# Patient Record
Sex: Female | Born: 1965 | Marital: Married | Smoking: Former smoker
Health system: Southern US, Community
[De-identification: ages and names within clinical notes are randomized; demographics above are authoritative.]

## PROBLEM LIST (undated history)

## (undated) DIAGNOSIS — J45990 Exercise induced bronchospasm: Secondary | ICD-10-CM

## (undated) DIAGNOSIS — T7840XA Allergy, unspecified, initial encounter: Secondary | ICD-10-CM

## (undated) HISTORY — PX: VAGINAL HYSTERECTOMY: SUR661

## (undated) HISTORY — DX: Allergy, unspecified, initial encounter: T78.40XA

---

## 2005-12-07 ENCOUNTER — Ambulatory Visit: Payer: Self-pay | Admitting: Family Medicine

## 2006-02-24 ENCOUNTER — Ambulatory Visit (HOSPITAL_COMMUNITY): Admission: RE | Admit: 2006-02-24 | Discharge: 2006-02-24 | Payer: Self-pay | Admitting: Gynecology

## 2006-03-10 ENCOUNTER — Encounter: Admission: RE | Admit: 2006-03-10 | Discharge: 2006-03-10 | Payer: Self-pay

## 2007-01-18 ENCOUNTER — Ambulatory Visit: Payer: Self-pay | Admitting: Obstetrics & Gynecology

## 2007-01-18 ENCOUNTER — Encounter: Payer: Self-pay | Admitting: Obstetrics & Gynecology

## 2007-02-01 ENCOUNTER — Ambulatory Visit: Payer: Self-pay | Admitting: Obstetrics & Gynecology

## 2007-03-06 ENCOUNTER — Ambulatory Visit: Payer: Self-pay | Admitting: Gynecology

## 2007-03-07 ENCOUNTER — Encounter: Payer: Self-pay | Admitting: Obstetrics & Gynecology

## 2007-03-07 ENCOUNTER — Inpatient Hospital Stay (HOSPITAL_COMMUNITY): Admission: RE | Admit: 2007-03-07 | Discharge: 2007-03-08 | Payer: Self-pay | Admitting: Obstetrics & Gynecology

## 2007-03-07 ENCOUNTER — Ambulatory Visit: Payer: Self-pay | Admitting: Obstetrics & Gynecology

## 2008-08-04 ENCOUNTER — Emergency Department: Payer: Self-pay | Admitting: Emergency Medicine

## 2011-01-19 NOTE — Assessment & Plan Note (Signed)
NAME:  MISSI, MCMACKIN NO.:  [id number]   MEDICAL RECORD NO.:  [id number]          PATIENT TYPE:  POB   LOCATION:  [facility] at [facility]         FACILITY:  [facility]   PHYSICIAN:  Allie Bossier, MD        DATE OF BIRTH:  [birth year]   DATE OF SERVICE:                                  CLINIC NOTE   HISTORY OF PRESENT ILLNESS:  The patient is a 44 year old, separated,  white, gravida 2, para 2, who comes in for her annual exam.  She reports  that she has been on Opcon 135 without any problems for multiple years  and she wants to try [place].  She is not having any problems with the  Opcon, but she just does not want to think about taking a pill every  day.  She does not want more children in the next 5 years, and  understands that spotting can be associated with Jearld Adjutant for months at a  time.   PAST MEDICAL HISTORY:  Asthma and allergies.   PAST SURGICAL HISTORY:  None.   MEDICATIONS:  1. She takes Singulair on an as-necessary basis.  2. Opcon 135.   ALLERGIES:  NO KNOWN DRUG ALLERGIES.   SOCIAL HISTORY:  She reports wine every day or every other day.   REVIEW OF SYSTEMS:  Her 45 year old and 64 year old children do not have  children.  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  She is currently  separated from her alcoholic husband and in the last several months had  sex with her ex-husband (first marriage), but otherwise is not having  intercourse.   FAMILY HISTORY:  Family history is significant for hypertension, but no  breast, GYN, or colon malignancies.   PHYSICAL EXAMINATION:  VITAL SIGNS:  Weight 115 pounds, blood pressure  157/86 (she has been evaluated for possible hypertension at Dr. Yetta Barre'  office, and she says that her blood pressures there are not elevated).  HEENT:  Normal.  HEART:  Regular rate and rhythm.  LUNGS:  Clear to auscultation bilaterally.  BREAST EXAM:  Normal.  ABDOMINAL  EXAM:  Normal.  PELVIC:  External genitalia normal.  Cervix normal.  Bimanual exam  reveals the uterus to be significantly deviated to her left and at a 14-  week size.  Of note, her exam last year on the dictated history and  physical reports her uterus to be normal in size and shape, and  retroverted.   ASSESSMENT AND PLAN:  Annual examination.  I have checked the Pap smear  with cultures.  With regard to her significant uterine enlargement, I  have scheduled a gynecologic ultrasound.  With regard to her possible  change in birth control method, I have given her a prescription for the  Opcon 135, and we will do a [place] after evaluation of her uterine  enlargement.      Allie Bossier, MD     MCD/MEDQ  D:  01/18/2007  T:  01/18/2007  Job:  [id number]

## 2011-01-19 NOTE — Discharge Summary (Signed)
Julie Washington, DIVEN NO.:  [id number]   MEDICAL RECORD NO.:  [id number]          PATIENT TYPE:  INP   LOCATION:  9308                          FACILITY:  [facility]   PHYSICIAN:  Ginger Carne, MD  DATE OF BIRTH:  03-20-1966   DATE OF ADMISSION:  03/07/2007  DATE OF DISCHARGE:  03/08/2007                               DISCHARGE SUMMARY   REASON FOR HOSPITALIZATION:  Leiomyomatous uterus and dysfunctional  uterine bleeding.   IN-HOSPITAL PROCEDURES:  Laparoscopic-assisted vaginal hysterectomy.   FINAL DIAGNOSIS:  Leiomyomatous uterus and dysfunctional uterine  bleeding.   HOSPITAL COURSE:  This patient is 45 year old Caucasian female who  underwent the aforementioned procedure on March 07, 2007.  Her  postoperative course was unremarkable.  Her hemoglobin had dropped from  13.1 preoperatively to 9.4 postoperatively.  Her vital signs remained  stable and she was afebrile.  Abdomen: Soft, scant vaginal flow, minimal  lower abdominal tenderness.  Calves without tenderness and lungs were  clear.  The patient was discharged with routine postoperative  instructions including contacting the office for temperature elevation  above 100.4 degrees Fahrenheit, increasing abdominal pain, genitourinary  or gastrointestinal symptomatology, or vaginal bleeding.  Dilaudid was  prescribed 2 mg to be taken one every 4-6 hours as needed for pain.  In  addition, she will continue her home medications including Singulair  daily and albuterol inhaler as necessary.  She will follow-up in 4 weeks  for postoperative visit with Dr. Marice Potter.  All questions answered to the  satisfaction of said patient and the patient verbalized understanding of  same.      Ginger Carne, MD  Electronically Signed     SHB/MEDQ  D:  03/08/2007  T:  03/08/2007  Job:  [id number]

## 2011-01-19 NOTE — Op Note (Signed)
Julie, Washington NO.:  [id number]   MEDICAL RECORD NO.:  [id number]          PATIENT TYPE:  INP   LOCATION:  9308                          FACILITY:  [facility]   PHYSICIAN:  Allie Bossier, MD        DATE OF BIRTH:  [birth year]   DATE OF PROCEDURE:  DATE OF DISCHARGE:                               OPERATIVE REPORT   PREOPERATIVE DIAGNOSIS:  Dysmenorrhea, fibroid uterus.   POSTOPERATIVE DIAGNOSIS:  Dysmenorrhea, fibroid uterus.   PROCEDURE:  Laparoscopic-assisted vaginal hysterectomy.   SURGEON:  Allie Bossier, M.D.   ANESTHESIA:  Quillian Quince, M.D., GETA.   COMPLICATIONS:  None.   ESTIMATED BLOOD LOSS:  500 ml.   SPECIMENS:  Uterus.   FINDINGS:  Grossly enlarged uterus with multiple fibroids, normal-  appearing ovaries, no evidence of endometriosis or other pelvic disease.   DETAILED PROCEDURE/FINDINGS:  In the preop area, the risks, benefits and  alternatives to the surgery were explained, understood, and accepted.  She wishes her ovaries left in place irregardless of the finding of  endometriosis or not.  All questions were answered.  Risks were  discussed.  Consents were signed.  She was taken to the operating room,  and general anesthesia was provided without complications.  She was  placed in a dorsal lithotomy position.  Her vagina and abdomen were  prepped and draped in the usual sterile fashion.  A Foley catheter was  placed.  Her bimanual exam revealed a grossly enlarged 14 week size  uterus consistent with fibroid.  A Hulka manipulator was placed.  Gloves  were changed, and attention was turned to the abdomen.  A vertical  umbilical incision was made.  A Veress needle was placed  intraperitoneally.  Localized CO2 was used to insufflate the abdomen,  approximately 2.5 liters.  An 11 mm trocar was placed.  Laparoscopy  confirmed correct placement.  She was placed in the Trendelenburg  position.  A 5 mm port was placed in each lower quadrant under  direct  laparoscopic visualization, taking care to avoid the inferior episode  vessels.  The pelvis was inspected.  There was a moderate amount of  blood noted in the pelvis.  Please note the patient finished her period  yesterday.  There was no evidence of endometriosis throughout the  pelvis.  The fibroid was knobby with multiple fibroids and grossly  enlarged, and yet mobile.  By tilting the uterus, the utero-ovarian and  round ligaments were placed on tension.  These were cauterized and cut  with the gyrus instrument.  Hemostasis was maintained throughout.  Once  the utero-ovarian ligaments and round ligaments were ligated, I removed  to the vaginal approach.  A weighted speculum was replaced after  removing the Hulka manipulator.  The cervix was grabbed with a single-  tooth tenaculum.  Approximately 30 ml of 0.5% Marcaine was used to  inject in a circumferential fashion at the cervicovaginal junction.  An  incision was made in a circumferential fashion at this site.  The  posterior peritoneum was entered, tagged and held.  The anterior  peritoneum  was identified and entered.  The uterosacral ligaments were  clamped, cut, and doubly ligated.  The 0 Vicryl was used throughout this  case unless otherwise specified.  The remainder of the cervical  attachments were separated using a clamp, cut, and ligate technique,  maintaining hemostasis throughout.  The uterus was then removed in a  piecemeal fashion until the remainder of the uterus was able to be  freely removed.  All pedicles were inspected and noted to be hemostatic.  The vaginal cuff was closed in a running, locking fashion using 0 Vicryl  suture.  The Foley catheter drained clear throughout the case.  Please  note that during the laparoscopy portion of the surgery, the oviduct  position was noted throughout the case, and they were noted to be out of  the operative site.  At the conclusion of the vaginal hysterectomy, I   reinsufflated her abdomen with carbon dioxide and looked from that  vantage point.  All hemostasis was noted throughout.  The ports were  removed.  The fascia at the umbilicus was closed with a 0 Vicryl suture.  Subcuticular closure was done with 4-0 Vicryl at all other sites.  She  tolerated the procedure well.  She was taken to the recovery room after  being extubated with the instrument, sponge, and needle count correct.  She tolerated the procedure well.      Allie Bossier, MD  Electronically Signed     MCD/MEDQ  D:  03/07/2007  T:  03/08/2007  Job:  [id number]

## 2011-06-22 LAB — CBC
HCT: 25.3 — ABNORMAL LOW
HCT: 27.6 — ABNORMAL LOW
HCT: 38.9
MCHC: 33.9
MCHC: 34
MCV: 84.7
MCV: 86.5
Platelets: 164
Platelets: 201
Platelets: 220
RBC: 2.94 — ABNORMAL LOW
RBC: 4.59
RDW: 13.5
RDW: 13.7

## 2011-06-22 LAB — TYPE AND SCREEN

## 2011-07-15 ENCOUNTER — Ambulatory Visit: Payer: Self-pay | Admitting: Surgery

## 2011-07-19 LAB — PATHOLOGY REPORT

## 2013-09-06 HISTORY — PX: BREAST EXCISIONAL BIOPSY: SUR124

## 2014-04-24 ENCOUNTER — Ambulatory Visit: Payer: Self-pay | Admitting: Obstetrics and Gynecology

## 2014-05-29 DIAGNOSIS — Z9889 Other specified postprocedural states: Secondary | ICD-10-CM | POA: Insufficient documentation

## 2014-05-29 DIAGNOSIS — N9489 Other specified conditions associated with female genital organs and menstrual cycle: Secondary | ICD-10-CM | POA: Insufficient documentation

## 2014-07-09 DIAGNOSIS — N809 Endometriosis, unspecified: Secondary | ICD-10-CM | POA: Insufficient documentation

## 2016-03-19 ENCOUNTER — Ambulatory Visit (INDEPENDENT_AMBULATORY_CARE_PROVIDER_SITE_OTHER): Payer: BLUE CROSS/BLUE SHIELD | Admitting: Family Medicine

## 2016-03-19 ENCOUNTER — Ambulatory Visit: Payer: Self-pay | Admitting: Family Medicine

## 2016-03-19 ENCOUNTER — Encounter: Payer: Self-pay | Admitting: Family Medicine

## 2016-03-19 VITALS — BP 140/98 | HR 80 | Ht 64.0 in | Wt 136.0 lb

## 2016-03-19 DIAGNOSIS — R0789 Other chest pain: Secondary | ICD-10-CM

## 2016-03-19 DIAGNOSIS — Z7689 Persons encountering health services in other specified circumstances: Secondary | ICD-10-CM

## 2016-03-19 DIAGNOSIS — Z7189 Other specified counseling: Secondary | ICD-10-CM

## 2016-03-19 DIAGNOSIS — J4520 Mild intermittent asthma, uncomplicated: Secondary | ICD-10-CM | POA: Diagnosis not present

## 2016-03-19 NOTE — Progress Notes (Signed)
Name: Julie Washington   MRN: [id number]    DOB: [birth year]   Date:03/19/2016       Progress Note  Subjective  Chief Complaint  Chief Complaint  Patient presents with  . Establish Care    moved back into [place]  . Chest Pain    tightness in chest- doesn't last long/ happens daily    HPI Comments: Patient presents to establish care.  Chest Pain  This is a new problem. The current episode started 1 to 4 weeks ago. The onset quality is gradual. The problem occurs daily (duration 10 minutes). The problem has been rapidly improving. The pain is present in the substernal region. The pain is at a severity of 2/10. The pain is mild. The quality of the pain is described as tightness and pressure. The pain does not radiate. Associated symptoms include shortness of breath. Pertinent negatives include no abdominal pain, back pain, cough, diaphoresis, dizziness, fever, headaches, malaise/fatigue, nausea, near-syncope, palpitations, PND or sputum production. Associated symptoms comments: Exertion and rest.    No problem-specific assessment & plan notes found for this encounter.   Past Medical History  Diagnosis Date  . Allergy     Past Surgical History  Procedure Laterality Date  . Vaginal hysterectomy      Family History  Problem Relation Age of Onset  . Diabetes Mother   . Diabetes Father   . Heart disease Father   . Cancer Maternal Grandmother   . Stroke Maternal Grandmother   . Heart disease Paternal Grandfather     Social History   Social History  . Marital Status: Married    Spouse Name: N/A  . Number of Children: N/A  . Years of Education: N/A   Occupational History  . Not on file.   Social History Main Topics  . Smoking status: Former Research scientist (life sciences)  . Smokeless tobacco: Not on file  . Alcohol Use: 0.0 oz/week    0 Standard drinks or equivalent per week  . Drug Use: No  . Sexual Activity: Yes   Other Topics Concern  . Not on file   Social History Narrative  . No  narrative on file    No Known Allergies   Review of Systems  Constitutional: Negative for fever, chills, weight loss, malaise/fatigue and diaphoresis.  HENT: Negative for ear discharge, ear pain and sore throat.   Eyes: Negative for blurred vision.  Respiratory: Positive for shortness of breath and wheezing. Negative for cough and sputum production.   Cardiovascular: Positive for chest pain. Negative for palpitations, leg swelling, PND and near-syncope.  Gastrointestinal: Negative for heartburn, nausea, abdominal pain, diarrhea, constipation, blood in stool and melena.  Genitourinary: Negative for dysuria, urgency, frequency and hematuria.  Musculoskeletal: Negative for myalgias, back pain, joint pain and neck pain.  Skin: Negative for rash.  Neurological: Negative for dizziness, tingling, sensory change, focal weakness and headaches.  Endo/Heme/Allergies: Negative for environmental allergies and polydipsia. Does not bruise/bleed easily.  Psychiatric/Behavioral: Negative for depression and suicidal ideas. The patient is not nervous/anxious and does not have insomnia.      Objective  Filed Vitals:   03/19/16 1444  BP: 140/98  Pulse: 80  Height: 5\' 4"  (1.626 m)  Weight: 136 lb (61.689 kg)    Physical Exam  Constitutional: She is well-developed, well-nourished, and in no distress. No distress.  HENT:  Head: Normocephalic and atraumatic.  Right Ear: External ear normal.  Left Ear: External ear normal.  Nose: Nose normal.  Mouth/Throat: Oropharynx is clear  and moist.  Eyes: Conjunctivae and EOM are normal. Pupils are equal, round, and reactive to light. Right eye exhibits no discharge. Left eye exhibits no discharge.  Neck: Normal range of motion. Neck supple. No JVD present. No thyromegaly present.  Cardiovascular: Normal rate, regular rhythm, S1 normal, S2 normal, normal heart sounds, intact distal pulses and normal pulses.  Exam reveals no gallop and no friction rub.   No  murmur heard. Pulmonary/Chest: Effort normal and breath sounds normal. She has no wheezes. She has no rales. She exhibits no tenderness.  Abdominal: Soft. Bowel sounds are normal. She exhibits no distension and no mass. There is no tenderness. There is no rebound and no guarding.  Musculoskeletal: Normal range of motion. She exhibits no edema.  Lymphadenopathy:    She has no cervical adenopathy.  Neurological: She is alert. She has normal reflexes.  Skin: Skin is warm and dry. She is not diaphoretic.  Psychiatric: Mood and affect normal.  Nursing note and vitals reviewed.     Assessment & Plan  Problem List Items Addressed This Visit    None    Visit Diagnoses    Encounter to establish care with new doctor    -  Primary    Other chest pain        Relevant Orders    EKG 12-Lead (Completed)    Asthma, mild intermittent, uncomplicated        Relevant Medications    albuterol (PROAIR HFA) 108 (90 Base) MCG/ACT inhaler         Dr. Deanna Jones [facility]  03/19/2016

## 2016-04-02 ENCOUNTER — Ambulatory Visit (INDEPENDENT_AMBULATORY_CARE_PROVIDER_SITE_OTHER): Payer: BLUE CROSS/BLUE SHIELD | Admitting: Family Medicine

## 2016-04-02 ENCOUNTER — Encounter: Payer: Self-pay | Admitting: Family Medicine

## 2016-04-02 VITALS — BP 132/100 | HR 72 | Ht 64.0 in | Wt 137.0 lb

## 2016-04-02 DIAGNOSIS — M940 Chondrocostal junction syndrome [Tietze]: Secondary | ICD-10-CM | POA: Diagnosis not present

## 2016-04-02 NOTE — Progress Notes (Signed)
Name: Julie Washington   MRN: [id number]    DOB: 1966-02-11   Date:04/02/2016       Progress Note  Subjective  Chief Complaint  Chief Complaint  Patient presents with  . Follow-up    chest wall pain- improved with Aleve    Chest Pain   This is a chronic problem. The current episode started more than 1 year ago. The onset quality is sudden. The problem occurs intermittently. The problem has been waxing and waning. The pain is present in the substernal region (along left costochondral margin). The pain is at a severity of 3/10. The pain is mild. The quality of the pain is described as sharp. The pain does not radiate. Pertinent negatives include no abdominal pain, back pain, claudication, cough, dizziness, exertional chest pressure, fever, headaches, malaise/fatigue, nausea, near-syncope, palpitations, PND, shortness of breath or sputum production. She has tried NSAIDs for the symptoms. The treatment provided moderate relief.    No problem-specific Assessment & Plan notes found for this encounter.   Past Medical History:  Diagnosis Date  . Allergy     Past Surgical History:  Procedure Laterality Date  . VAGINAL HYSTERECTOMY      Family History  Problem Relation Age of Onset  . Diabetes Mother   . Diabetes Father   . Heart disease Father   . Cancer Maternal Grandmother   . Stroke Maternal Grandmother   . Heart disease Paternal Grandfather     Social History   Social History  . Marital status: Married    Spouse name: N/A  . Number of children: N/A  . Years of education: N/A   Occupational History  . Not on file.   Social History Main Topics  . Smoking status: Former Research scientist (life sciences)  . Smokeless tobacco: Never Used  . Alcohol use 0.0 oz/week  . Drug use: No  . Sexual activity: Yes   Other Topics Concern  . Not on file   Social History Narrative  . No narrative on file    No Known Allergies   Review of Systems  Constitutional: Negative for chills, fever, malaise/fatigue  and weight loss.  HENT: Negative for ear discharge, ear pain and sore throat.   Eyes: Negative for blurred vision.  Respiratory: Negative for cough, sputum production, shortness of breath and wheezing.   Cardiovascular: Positive for chest pain. Negative for palpitations, claudication, leg swelling, PND and near-syncope.  Gastrointestinal: Negative for abdominal pain, blood in stool, constipation, diarrhea, heartburn, melena and nausea.  Genitourinary: Negative for dysuria, frequency, hematuria and urgency.  Musculoskeletal: Negative for back pain, joint pain, myalgias and neck pain.  Skin: Negative for rash.  Neurological: Negative for dizziness, tingling, sensory change, focal weakness and headaches.  Endo/Heme/Allergies: Negative for environmental allergies and polydipsia. Does not bruise/bleed easily.  Psychiatric/Behavioral: Negative for depression and suicidal ideas. The patient is not nervous/anxious and does not have insomnia.      Objective  Vitals:   04/02/16 0847  BP: (!) 132/100  Pulse: 72  Weight: 137 lb (62.1 kg)  Height: 5\' 4"  (1.626 m)    Physical Exam  Constitutional: She is well-developed, well-nourished, and in no distress. No distress.  HENT:  Head: Normocephalic and atraumatic.  Right Ear: External ear normal.  Left Ear: External ear normal.  Nose: Nose normal.  Mouth/Throat: Oropharynx is clear and moist.  Eyes: Conjunctivae and EOM are normal. Pupils are equal, round, and reactive to light. Right eye exhibits no discharge. Left eye exhibits no discharge.  Neck:  Normal range of motion. Neck supple. No JVD present. No thyromegaly present.  Cardiovascular: Normal rate, regular rhythm, normal heart sounds and intact distal pulses.  Exam reveals no gallop and no friction rub.   No murmur heard. Pulmonary/Chest: Effort normal and breath sounds normal.  Abdominal: Soft. Bowel sounds are normal. She exhibits no mass. There is no tenderness. There is no guarding.   Musculoskeletal: Normal range of motion. She exhibits no edema.  Lymphadenopathy:    She has no cervical adenopathy.  Neurological: She is alert. She has normal reflexes.  Skin: Skin is warm and dry. She is not diaphoretic.  Psychiatric: Mood and affect normal.  Nursing note and vitals reviewed.     Assessment & Plan  Problem List Items Addressed This Visit    None    Visit Diagnoses    Costochondritis    -  Primary        Dr. Otilio Miu [facility]  04/02/16

## 2016-09-16 ENCOUNTER — Ambulatory Visit
Admission: EM | Admit: 2016-09-16 | Discharge: 2016-09-16 | Disposition: A | Discharge status: Home / Self Care | Payer: BLUE CROSS/BLUE SHIELD

## 2016-09-16 ENCOUNTER — Encounter: Payer: Self-pay | Admitting: *Deleted

## 2016-09-16 DIAGNOSIS — J45901 Unspecified asthma with (acute) exacerbation: Secondary | ICD-10-CM

## 2016-09-16 DIAGNOSIS — R69 Illness, unspecified: Secondary | ICD-10-CM | POA: Diagnosis not present

## 2016-09-16 DIAGNOSIS — J111 Influenza due to unidentified influenza virus with other respiratory manifestations: Secondary | ICD-10-CM

## 2016-09-16 MED ORDER — OSELTAMIVIR PHOSPHATE 75 MG PO CAPS: 75.0000 mg | ORAL_CAPSULE | Freq: Two times a day (BID) | ORAL | 0 refills | Status: DC

## 2016-09-16 MED ORDER — ALBUTEROL SULFATE HFA 108 (90 BASE) MCG/ACT IN AERS: 2.0000 | INHALATION_SPRAY | RESPIRATORY_TRACT | 0 refills | Status: DC | PRN

## 2016-09-16 MED ORDER — BENZONATATE 100 MG PO CAPS: 100.0000 mg | ORAL_CAPSULE | Freq: Three times a day (TID) | ORAL | 0 refills | Status: DC | PRN

## 2016-09-16 MED ORDER — PREDNISONE 20 MG PO TABS: 40.0000 mg | ORAL_TABLET | Freq: Every day | ORAL | 0 refills | Status: AC

## 2016-09-16 MED ORDER — HYDROCOD POLST-CPM POLST ER 10-8 MG/5ML PO SUER: 5.0000 mL | Freq: Every evening | ORAL | 0 refills | Status: DC | PRN

## 2016-09-16 NOTE — ED Triage Notes (Signed)
Non-productive cough, runny nose, head and chest congestion, headache, chills, body aches, since Monday.

## 2016-09-16 NOTE — ED Provider Notes (Signed)
[facility] ____________________________________________  Time seen: Approximately 8:55 AM  I have reviewed the triage vital signs and the nursing notes.   HISTORY  Chief Complaint Cough and Nasal Congestion   HPI Julie Washington is a 51 y.o. female presenting for the complaints of 2-3 days of cough, runny nose, nasal congestion, chills and body aches. Reports occasional wheeze noted with cough. Reports history of asthma. Reports cough and nasal congestion or primarily clear or nonproductive. Reports overall continues to eat and drink well. Reports significant other recently with similar symptoms. Reports quick onset of symptoms. States has used home albuterol inhaler with intermittent improvement with cough and wheezing. Denies recent sickness. Denies sore throat.   Denies chest pain, shortness of breath, abdominal pain, dysuria, extremity pain, extremity swelling, recent sickness, recent antibiotic use.  Otilio Miu, MD: PCP No LMP recorded. Patient has had a hysterectomy.   Past Medical History:  Diagnosis Date  . Allergy   Asthma  Patient Active Problem List   Diagnosis Date Noted  . Endometriosis 07/09/2014  . Adnexal mass 05/29/2014  . Postoperative state 05/29/2014    Past Surgical History:  Procedure Laterality Date  . VAGINAL HYSTERECTOMY      No current facility-administered medications for this encounter.   Current Outpatient Prescriptions:  .  azelastine (OPTIVAR) 0.05 % ophthalmic solution, , Disp: , Rfl:  .  fluticasone (FLONASE) 50 MCG/ACT nasal spray, Place into both nostrils daily., Disp: , Rfl:  .  ibuprofen (ADVIL,MOTRIN) 400 MG tablet, Take 400 mg by mouth every 6 (six) hours as needed., Disp: , Rfl:  .  albuterol (PROVENTIL HFA;VENTOLIN HFA) 108 (90 Base) MCG/ACT inhaler, Inhale 2 puffs into the lungs every 4 (four) hours as needed for wheezing., Disp: 1 Inhaler, Rfl: 0 .  benzonatate (TESSALON PERLES) 100 MG capsule, Take 1 capsule (100  mg total) by mouth 3 (three) times daily as needed for cough., Disp: 15 capsule, Rfl: 0 .  chlorpheniramine-HYDROcodone (TUSSIONEX PENNKINETIC ER) 10-8 MG/5ML SUER, Take 5 mLs by mouth at bedtime as needed for cough. do not drive or operate machinery while taking as can cause drowsiness., Disp: 75 mL, Rfl: 0 .  oseltamivir (TAMIFLU) 75 MG capsule, Take 1 capsule (75 mg total) by mouth every 12 (twelve) hours., Disp: 10 capsule, Rfl: 0 .  predniSONE (DELTASONE) 20 MG tablet, Take 2 tablets (40 mg total) by mouth daily., Disp: 6 tablet, Rfl: 0  Allergies Patient has no known allergies.  Family History  Problem Relation Age of Onset  . Diabetes Mother   . Diabetes Father   . Heart disease Father   . Cancer Maternal Grandmother   . Stroke Maternal Grandmother   . Heart disease Paternal Grandfather     Social History Social History  Substance Use Topics  . Smoking status: Former Research scientist (life sciences)  . Smokeless tobacco: Never Used  . Alcohol use 0.0 oz/week    Review of Systems Constitutional: As above.  Eyes: No visual changes. ENT: No sore throat. Cardiovascular: Denies chest pain. Respiratory: Denies shortness of breath. Gastrointestinal: No abdominal pain.  No nausea, no vomiting.  No diarrhea.  No constipation. Genitourinary: Negative for dysuria. Musculoskeletal: Negative for back pain. Skin: Negative for rash. Neurological: Negative for headaches, focal weakness or numbness.  10-point ROS otherwise negative.  ____________________________________________   PHYSICAL EXAM:  VITAL SIGNS: ED Triage Vitals  Enc Vitals Group     BP 09/16/16 0830 110/74     Pulse Rate 09/16/16 0830 99  Resp 09/16/16 0830 16     Temp 09/16/16 0830 99.3 F (37.4 C)     Temp Source 09/16/16 0830 Oral     SpO2 09/16/16 0830 97 %     Weight 09/16/16 0832 130 lb (59 kg)     Height 09/16/16 0832 5\' 4"  (1.626 m)     Head Circumference --      Peak Flow --      Pain Score --      Pain Loc --       Pain Edu? --      Excl. in [place]? --     Constitutional: Alert and oriented. Well appearing and in no acute distress. Eyes: Conjunctivae are normal. PERRL. EOMI. Head: Atraumatic. No sinus tenderness to palpation. No swelling. No erythema.  Ears: no erythema, normal TMs bilaterally.   Nose:Nasal congestion with clear rhinorrhea  Mouth/Throat: Mucous membranes are moist. No pharyngeal erythema. No tonsillar swelling or exudate.  Neck: No stridor.  No cervical spine tenderness to palpation. Hematological/Lymphatic/Immunilogical: No cervical lymphadenopathy. Cardiovascular: Normal rate, regular rhythm. Grossly normal heart sounds.  Good peripheral circulation. Respiratory: Normal respiratory effort.  No retractions. Mild scattered wheezes. No rhonchi or rales. Good air movement.  Gastrointestinal: Soft and nontender.  Musculoskeletal: No lower extremity edema noted. Ambulatory with steady gait.  No cervical, thoracic or lumbar tenderness to palpation. Neurologic:  Normal speech and language.No gait instability. Skin:  Skin is warm, dry and intact. No rash noted. Psychiatric: Mood and affect are normal. Speech and behavior are normal. chiatric: Mood and affect are normal. Speech and behavior are normal. Patient exhibits appropriate insight and judgment   ___________________________________________   LABS (all labs ordered are listed, but only abnormal results are displayed)  Labs Reviewed - No data to display ____________________________________________   PROCEDURES Procedures    INITIAL IMPRESSION / ASSESSMENT AND PLAN / ED COURSE  Pertinent labs & imaging results that were available during my care of the patient were reviewed by me and considered in my medical decision making (see chart for details).  Well-appearing patient. No acute distress. Presents for complaints of 3 days of constant cough, congestion, chills and body aches. Suspect influenza-like illness. Discussed evaluation  by influenza swab versus treatment, patient declined swab. Will treat patient for suspected influenza-like illness and asthma exacerbation. Will treat patient with oral Tamiflu, 3 day course prednisone, when necessary Tessalon Perles, prn Tussionex. Patient also requested refill of home albuterol inhaler, Rx given. Encouraged supportive care, rest, fluids and use PCP follow-up. Discussed strict follow-up for no improvement of symptoms, fevers or worsening concerns.Discussed indication, risks and benefits of medications with patient.  Discussed follow up with Primary care physician this week. Discussed follow up and return parameters including no resolution or any worsening concerns. Patient verbalized understanding and agreed to plan.    ____________________________________________   FINAL CLINICAL IMPRESSION(S) / ED DIAGNOSES  Final diagnoses:  Influenza-like illness  Exacerbation of asthma, unspecified asthma severity, unspecified whether persistent     Discharge Medication List as of 09/16/2016  8:58 AM    START taking these medications   Details  benzonatate (TESSALON PERLES) 100 MG capsule Take 1 capsule (100 mg total) by mouth 3 (three) times daily as needed for cough., Starting Thu 09/16/2016, Normal    chlorpheniramine-HYDROcodone (TUSSIONEX PENNKINETIC ER) 10-8 MG/5ML SUER Take 5 mLs by mouth at bedtime as needed for cough. do not drive or operate machinery while taking as can cause drowsiness., Starting Thu 09/16/2016, Print  predniSONE (DELTASONE) 20 MG tablet Take 2 tablets (40 mg total) by mouth daily., Starting Thu 09/16/2016, Until Sun 09/19/2016, Normal        Note: This dictation was prepared with Dragon dictation along with smaller phrase technology. Any transcriptional errors that result from this process are unintentional.    Clinical Course       Marylene Land, NP 09/16/16 [phone]

## 2016-09-16 NOTE — Discharge Instructions (Signed)
Take medication as prescribed. Rest. Drink plenty of fluids.  ° °Follow up with your primary care physician this week as needed. Return to Urgent care for new or worsening concerns.  ° °

## 2017-04-20 ENCOUNTER — Encounter: Payer: Self-pay | Admitting: Obstetrics and Gynecology

## 2017-04-20 ENCOUNTER — Ambulatory Visit (INDEPENDENT_AMBULATORY_CARE_PROVIDER_SITE_OTHER): Payer: BLUE CROSS/BLUE SHIELD | Admitting: Obstetrics and Gynecology

## 2017-04-20 VITALS — BP 148/90 | HR 64 | Ht 64.0 in | Wt 134.5 lb

## 2017-04-20 DIAGNOSIS — Z01419 Encounter for gynecological examination (general) (routine) without abnormal findings: Secondary | ICD-10-CM

## 2017-04-20 NOTE — Progress Notes (Signed)
Subjective:   Julie Washington is a 51 y.o. G12P2 Caucasian female here for a routine well-woman exam.  No LMP recorded. Patient has had a hysterectomy.    Current complaints: vaginal dryness and low sex drive. 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  Past Medical History Past Medical History:  Diagnosis Date  . Allergy     Past Surgical History Past Surgical History:  Procedure Laterality Date  . VAGINAL HYSTERECTOMY      Gynecologic History G2P2  No LMP recorded. Patient has had a hysterectomy. Contraception: post menopausal status Last Pap: 2013. Results were: normal Last mammogram: 2016. Results were: normal   Obstetric History OB History  Gravida Para Term Preterm AB Living  2 2          SAB TAB Ectopic Multiple Live Births               # Outcome Date GA Lbr Len/2nd Weight Sex Delivery Anes PTL Lv  2 Para 1989    M Vag-Spont     1 Para 1988    F Vag-Spont         Current Medications Current Outpatient Prescriptions on File Prior to Visit  Medication Sig Dispense Refill  . albuterol (PROVENTIL HFA;VENTOLIN HFA) 108 (90 Base) MCG/ACT inhaler Inhale 2 puffs into the lungs every 4 (four) hours as needed for wheezing. 1 Inhaler 0  . azelastine (OPTIVAR) 0.05 % ophthalmic solution     . ibuprofen (ADVIL,MOTRIN) 400 MG tablet Take 400 mg by mouth every 6 (six) hours as needed.    . fluticasone (FLONASE) 50 MCG/ACT nasal spray Place into both nostrils daily.     No current facility-administered medications on file prior to visit.     Review of  Systems Patient denies any headaches, blurred vision, shortness of breath, chest pain, abdominal pain, problems with bowel movements, urination, or intercourse.  Objective:  BP (!) 148/90   Pulse 64   Ht 5\' 4"  (1.626 m)   Wt 134 lb 8 oz (61 kg)   BMI 23.09 kg/m  Physical Exam  General:  Well developed, well nourished, no acute distress. She is alert and oriented x3. Skin:  Warm and dry Neck:  Midline trachea, no thyromegaly or nodules Cardiovascular: Regular rate and rhythm, no murmur heard Lungs:  Effort normal, all lung fields clear to auscultation bilaterally Breasts:  No dominant palpable mass, retraction, or nipple discharge Abdomen:  Soft, non tender, no hepatosplenomegaly or masses Pelvic:  External genitalia is normal in appearance.  The vagina is normal in appearance. The cervix is bulbous, no CMT.  Thin prep pap is done with HR HPV cotesting. Uterus is surgically absent. No adnexal masses or tenderness noted.   No swelling or varicosities noted Psych:  She has a normal mood and affect  Assessment:   Healthy well-woman exam Family history of ovarian cancer in paternal grandmother Family history of breast cancer in great maternal aunt  Plan:  Labs obtained-will follow up acordingly F/U 1 year for Ae, or sooner if needed Mammogram ordered  Yaretzy Olazabal Rockney Ghee, CNM

## 2017-04-21 ENCOUNTER — Other Ambulatory Visit: Payer: Self-pay | Admitting: Obstetrics and Gynecology

## 2017-04-21 LAB — HEMOGLOBIN A1C
Est. average glucose Bld gHb Est-mCnc: 94 mg/dL
Hgb A1c MFr Bld: 4.9 % (ref 4.8–5.6)

## 2017-04-21 LAB — LIPID PANEL
CHOLESTEROL TOTAL: 191 mg/dL (ref 100–199)
Chol/HDL Ratio: 2.9 ratio (ref 0.0–4.4)
HDL: 65 mg/dL (ref >39)
LDL Calculated: 106 mg/dL — ABNORMAL HIGH (ref 0–99)
Triglycerides: 98 mg/dL (ref 0–149)
VLDL Cholesterol Cal: 20 mg/dL (ref 5–40)

## 2017-04-21 LAB — COMPREHENSIVE METABOLIC PANEL
A/G RATIO: 2.1 (ref 1.2–2.2)
ALBUMIN: 4.8 g/dL (ref 3.5–5.5)
ALK PHOS: 78 IU/L (ref 39–117)
ALT: 12 IU/L (ref 0–32)
AST: 20 IU/L (ref 0–40)
BILIRUBIN TOTAL: 1 mg/dL (ref 0.0–1.2)
BUN / CREAT RATIO: 20 (ref 9–23)
BUN: 16 mg/dL (ref 6–24)
CALCIUM: 9.5 mg/dL (ref 8.7–10.2)
CO2: 24 mmol/L (ref 20–29)
Chloride: 103 mmol/L (ref 96–106)
Creatinine, Ser: 0.81 mg/dL (ref 0.57–1.00)
GFR calc non Af Amer: 84 mL/min/{1.73_m2} (ref >59)
GFR, EST AFRICAN AMERICAN: 97 mL/min/{1.73_m2} (ref >59)
GLOBULIN, TOTAL: 2.3 g/dL (ref 1.5–4.5)
GLUCOSE: 91 mg/dL (ref 65–99)
POTASSIUM: 4.5 mmol/L (ref 3.5–5.2)
Sodium: 143 mmol/L (ref 134–144)
TOTAL PROTEIN: 7.1 g/dL (ref 6.0–8.5)

## 2017-04-22 LAB — CYTOLOGY - PAP

## 2017-04-26 ENCOUNTER — Telehealth: Payer: Self-pay | Admitting: *Deleted

## 2017-04-26 NOTE — Telephone Encounter (Signed)
-----   Message from Joylene Igo, [place] sent at 04/26/2017 12:04 PM EDT ----- Pap and HPV were both negative

## 2017-04-26 NOTE — Telephone Encounter (Signed)
Mailed lab results to pt

## 2017-04-27 ENCOUNTER — Inpatient Hospital Stay
Admission: RE | Admit: 2017-04-27 | Discharge: 2017-04-27 | Disposition: A | Discharge status: Home / Self Care | Payer: Self-pay | Source: Ambulatory Visit | Attending: *Deleted | Admitting: *Deleted

## 2017-04-27 ENCOUNTER — Other Ambulatory Visit: Payer: Self-pay | Admitting: *Deleted

## 2017-04-27 DIAGNOSIS — Z9289 Personal history of other medical treatment: Secondary | ICD-10-CM

## 2017-05-31 ENCOUNTER — Ambulatory Visit
Admission: RE | Admit: 2017-05-31 | Discharge: 2017-05-31 | Disposition: A | Discharge status: Home / Self Care | Payer: BLUE CROSS/BLUE SHIELD | Source: Ambulatory Visit | Attending: Obstetrics and Gynecology | Admitting: Obstetrics and Gynecology

## 2017-05-31 DIAGNOSIS — Z1231 Encounter for screening mammogram for malignant neoplasm of breast: Secondary | ICD-10-CM | POA: Diagnosis present

## 2017-05-31 DIAGNOSIS — R921 Mammographic calcification found on diagnostic imaging of breast: Secondary | ICD-10-CM | POA: Diagnosis not present

## 2017-05-31 DIAGNOSIS — Z01419 Encounter for gynecological examination (general) (routine) without abnormal findings: Secondary | ICD-10-CM

## 2017-06-03 ENCOUNTER — Other Ambulatory Visit: Payer: Self-pay | Admitting: Obstetrics and Gynecology

## 2017-06-03 DIAGNOSIS — R928 Other abnormal and inconclusive findings on diagnostic imaging of breast: Secondary | ICD-10-CM

## 2017-06-06 ENCOUNTER — Other Ambulatory Visit: Payer: Self-pay | Admitting: Obstetrics and Gynecology

## 2017-06-10 ENCOUNTER — Ambulatory Visit
Admission: RE | Admit: 2017-06-10 | Discharge: 2017-06-10 | Disposition: A | Discharge status: Home / Self Care | Payer: BLUE CROSS/BLUE SHIELD | Source: Ambulatory Visit | Attending: Obstetrics and Gynecology | Admitting: Obstetrics and Gynecology

## 2017-06-10 ENCOUNTER — Other Ambulatory Visit: Payer: Self-pay | Admitting: Obstetrics and Gynecology

## 2017-06-10 DIAGNOSIS — R928 Other abnormal and inconclusive findings on diagnostic imaging of breast: Secondary | ICD-10-CM

## 2017-06-10 DIAGNOSIS — R921 Mammographic calcification found on diagnostic imaging of breast: Secondary | ICD-10-CM | POA: Diagnosis not present

## 2017-08-16 ENCOUNTER — Telehealth: Payer: Self-pay | Admitting: Obstetrics and Gynecology

## 2017-08-16 NOTE — Telephone Encounter (Signed)
The patient called and stated that she is having some menopause issues and she is experiencing some vaginal itching and dryness, (no odor) and the over the counter that was recommenced has stopped working. No other information was disclosed, other than wanting something to help with her problem. Please advise.

## 2017-08-16 NOTE — Telephone Encounter (Signed)
pls advise

## 2017-08-17 ENCOUNTER — Other Ambulatory Visit: Payer: Self-pay | Admitting: *Deleted

## 2017-08-17 ENCOUNTER — Telehealth: Payer: Self-pay | Admitting: Obstetrics and Gynecology

## 2017-08-17 MED ORDER — ESTRADIOL 0.1 MG/GM VA CREA: 1.0000 | TOPICAL_CREAM | Freq: Every day | VAGINAL | 12 refills | Status: DC

## 2017-08-17 NOTE — Telephone Encounter (Signed)
The patient called to check on her issue she called about yesterday, The patient would like a call back if possible. No other information was disclosed. Please advise.

## 2017-08-17 NOTE — Telephone Encounter (Signed)
Notified pt. 

## 2017-08-22 NOTE — Telephone Encounter (Signed)
Please have her make an appointment to be seen

## 2017-08-29 ENCOUNTER — Ambulatory Visit: Payer: BLUE CROSS/BLUE SHIELD | Admitting: Certified Nurse Midwife

## 2017-08-29 ENCOUNTER — Encounter: Payer: Self-pay | Admitting: Certified Nurse Midwife

## 2017-08-29 VITALS — BP 149/79 | HR 63 | Ht 64.0 in | Wt 132.4 lb

## 2017-08-29 DIAGNOSIS — L292 Pruritus vulvae: Secondary | ICD-10-CM | POA: Diagnosis not present

## 2017-08-29 DIAGNOSIS — N949 Unspecified condition associated with female genital organs and menstrual cycle: Secondary | ICD-10-CM | POA: Diagnosis not present

## 2017-08-29 DIAGNOSIS — N9489 Other specified conditions associated with female genital organs and menstrual cycle: Secondary | ICD-10-CM

## 2017-08-29 MED ORDER — CLOBETASOL PROPIONATE 0.05 % EX OINT: TOPICAL_OINTMENT | CUTANEOUS | 1 refills | Status: DC

## 2017-08-29 NOTE — Progress Notes (Signed)
GYN ENCOUNTER NOTE  Subjective:       Julie Washington is a 51 y.o. G2P2 female here for gynecologic evaluation of the following issues:  1. Vulvar itching 2. Vulvar burning  Reports intermittent episodes of vulvar itching and burning for the last six (6) months. This episode started approximately three (3) weeks ago. No relief with home treatment measures. Notes increased exercise and persistent hot flashes as only other changes.   Denies difficulty breathing or respiratory distress, chest pain, abdominal pain, vaginal bleeding, dysuria, vaginal discharge or odor and leg pain or swelling.    Gynecologic History  No LMP recorded. Patient has had a hysterectomy.   Contraception: status post hysterectomy  Last Pap: 2018. Results were: normal.   Last mammogram: 2018. Results were: BI-RADS 3, recommendation for follow up in six (6) months.   Obstetric History  OB History  Gravida Para Term Preterm AB Living  2 2          SAB TAB Ectopic Multiple Live Births               # Outcome Date GA Lbr Len/2nd Weight Sex Delivery Anes PTL Lv  2 Para 1989    M Vag-Spont     1 Para 1988    F Vag-Spont         Past Medical History:  Diagnosis Date  . Allergy     Past Surgical History:  Procedure Laterality Date  . BREAST EXCISIONAL BIOPSY Left 2015    cyst  . VAGINAL HYSTERECTOMY      Current Outpatient Medications on File Prior to Visit  Medication Sig Dispense Refill  . albuterol (PROVENTIL HFA;VENTOLIN HFA) 108 (90 Base) MCG/ACT inhaler Inhale 2 puffs into the lungs every 4 (four) hours as needed for wheezing. 1 Inhaler 0  . azelastine (OPTIVAR) 0.05 % ophthalmic solution     . fluticasone (FLONASE) 50 MCG/ACT nasal spray Place into both nostrils daily.    Marland Kitchen ibuprofen (ADVIL,MOTRIN) 400 MG tablet Take 400 mg by mouth every 6 (six) hours as needed.     No current facility-administered medications on file prior to visit.     No Known Allergies  Social History   Socioeconomic  History  . Marital status: Married    Spouse name: Not on file  . Number of children: Not on file  . Years of education: Not on file  . Highest education level: Not on file  Social Needs  . Financial resource strain: Not on file  . Food insecurity - worry: Not on file  . Food insecurity - inability: Not on file  . Transportation needs - medical: Not on file  . Transportation needs - non-medical: Not on file  Occupational History  . Not on file  Tobacco Use  . Smoking status: Former Research scientist (life sciences)  . Smokeless tobacco: Never Used  Substance and Sexual Activity  . Alcohol use: Yes    Alcohol/week: 0.0 oz  . Drug use: No  . Sexual activity: Yes    Birth control/protection: Surgical  Other Topics Concern  . Not on file  Social History Narrative  . Not on file    Family History  Problem Relation Age of Onset  . Diabetes Mother   . Diabetes Father   . Heart disease Father   . Cancer Maternal Grandmother   . Stroke Maternal Grandmother   . Heart disease Paternal Grandfather   . Breast cancer Other     The following portions of the patient's  history were reviewed and updated as appropriate: allergies, current medications, past family history, past medical history, past social history, past surgical history and problem list.  Review of Systems Review of Systems - Negative except as noted above.  History obtained from the patient.   Objective:   BP (!) 149/79   Pulse 63   Ht 5\' 4"  (1.626 m)   Wt 132 lb 6.4 oz (60.1 kg)   BMI 22.73 kg/m    CONSTITUTIONAL: Well-developed, well-nourished female in no acute distress.   HENT:  Normocephalic, atraumatic.   SKIN: Skin is warm and dry. No rash noted. Not diaphoretic. No erythema. No pallor.  [place]: Alert and oriented to person, place, and time.   PSYCHIATRIC: Normal mood and affect. Normal behavior. Normal judgment and thought content.  PELVIC:  External Genitalia: Erythema present with silver tone noted  Vagina: White  discharge present  MUSCULOSKELETAL: Normal range of motion. No tenderness.  No cyanosis, clubbing, or edema.  Assessment:   1. Vulvar itching  - Pathology - NuSwab Vaginitis Plus (VG+)  2. Vaginal burning  - Pathology - NuSwab Vaginitis Plus (VG+)  Plan:   Labs: NuSwab and punch biospy, see orders and note below.   Rx: Clobetasol, see orders.   Handouts given regarding Lichens Sclerosus and treatment options.   Reviewed red flag symptoms and when to call.   RTC x 6 weeks for follow up with Melody.    Diona Fanti, CNM [facility], [facility]   Procedure Note:  After informed written consent was obtained, using Betadine for cleansing and 1% Lidocaine without epinephrine for anesthetic, with sterile technique a three (3) mm punch biopsy was used to obtain a biopsy specimen of the lesion. Hemostasis was obtained by pressure and silver nitrite. Wound care instructions provided. Be alert for any signs of cutaneous infection. The specimen is labeled and sent to pathology for evaluation. The procedure was well tolerated without complications.   Diona Fanti, CNM [facility], [facility]

## 2017-08-29 NOTE — Patient Instructions (Signed)
Lichen Sclerosus Lichen sclerosus is a skin problem. It can happen on any part of the body. It happens most often in the anal or genital areas. It can cause itching and discomfort. Treatment can help to control symptoms. This skin problem is not passed from one person to another (not contagious). The cause is not known. Follow these instructions at home:  Take over-the-counter and prescription medicines only as told by your doctor.  Use creams or ointments as told by your doctor.  Do not scratch the affected areas of skin.  Women should keep the vagina as clean and dry as they can.  Keep all follow-up visits as told by your doctor. This is important. Contact a doctor if:  Your redness, swelling, or pain gets worse.  You have fluid, blood, or pus coming from the area.  You have new patches (lesions) on your skin.  You have a fever.  You have pain during sex. This information is not intended to replace advice given to you by your health care provider. Make sure you discuss any questions you have with your health care provider. Document Released: 08/05/2008 Document Revised: 01/29/2016 Document Reviewed: 11/18/2014 Elsevier Interactive Patient Education  2018 [place]. Clobetasol Propionate skin cream What is this medicine? CLOBETASOL (kloe BAY ta sol) is a corticosteroid. It is used on the skin to treat itching, redness, and swelling caused by some skin conditions. This medicine may be used for other purposes; ask your health care provider or pharmacist if you have questions. COMMON BRAND NAME(S): Cormax, Embeline, Embeline E, Impoyz, Temovate, Temovate E What should I tell my health care provider before I take this medicine? They need to know if you have any of these conditions: -any type of active infection including measles, tuberculosis, herpes, or chickenpox -circulation problems or vascular disease -large areas of burned or damaged skin -rosacea -skin wasting or  thinning -an unusual or allergic reaction to clobetasol, corticosteroids, other medicines, foods, dyes, or preservatives -pregnant or trying to get pregnant -breast-feeding How should I use this medicine? This medicine is for external use only. Do not take by mouth. Follow the directions on the prescription label. Wash your hands before and after use. Apply a thin film of medicine to the affected area. Do not cover with a bandage or dressing unless your doctor or health care professional tells you to. Do not get this medicine in your eyes. If you do, rinse out with plenty of cool tap water. It is important not to use more medicine than prescribed. Do not use your medicine more often than directed. To do so may increase the chance of side effects. Talk to your pediatrician regarding the use of this medicine in children. Special care may be needed. Elderly patients are more likely to have damaged skin through aging, and this may increase side effects. This medicine should only be used for brief periods and infrequently in older patients. Overdosage: If you think you have taken too much of this medicine contact a poison control center or emergency room at once. NOTE: This medicine is only for you. Do not share this medicine with others. What if I miss a dose? If you miss a dose, use it as soon as you can. If it is almost time for your next dose, use only that dose. Do not use double or extra doses. What may interact with this medicine? Interactions are not expected. Do not use cosmetics or other skin care products on the treated area. This list  may not describe all possible interactions. Give your health care provider a list of all the medicines, herbs, non-prescription drugs, or dietary supplements you use. Also tell them if you smoke, drink alcohol, or use illegal drugs. Some items may interact with your medicine. What should I watch for while using this medicine? Tell your doctor or health care  professional if your symptoms do not get better within 2 weeks, or if you develop skin irritation from the medicine. Tell your doctor or health care professional if you are exposed to anyone with measles or chickenpox, or if you develop sores or blisters that do not heal properly. What side effects may I notice from receiving this medicine? Side effects that you should report to your doctor or health care professional as soon as possible: -allergic reactions like skin rash, itching or hives, swelling of the face, lips, or tongue -changes in vision -lack of healing of the skin condition -painful, red, pus filled blisters on the skin or in hair follicles -thinning of the skin with easy bruising Side effects that usually do not require medical attention (report to your doctor or health care professional if they continue or are bothersome): -burning, irritation of the skin -redness or scaling of the skin This list may not describe all possible side effects. Call your doctor for medical advice about side effects. You may report side effects to FDA at 1-800-FDA-1088. Where should I keep my medicine? Keep out of the reach of children. Store at room temperature between 15 and 30 degrees C (59 and 86 degrees F). Keep away from heat and direct light. Do not freeze. Throw away any unused medicine after the expiration date. NOTE: This sheet is a summary. It may not cover all possible information. If you have questions about this medicine, talk to your doctor, pharmacist, or health care provider.  2018 Elsevier/Gold Standard (2007-11-29 16:56:45)

## 2017-09-01 LAB — PATHOLOGY

## 2017-09-02 LAB — NUSWAB VAGINITIS PLUS (VG+)
CANDIDA ALBICANS, NAA: NEGATIVE
CANDIDA GLABRATA, NAA: NEGATIVE
Chlamydia trachomatis, NAA: NEGATIVE
Neisseria gonorrhoeae, NAA: NEGATIVE
TRICH VAG BY NAA: NEGATIVE

## 2017-10-13 ENCOUNTER — Encounter: Payer: BLUE CROSS/BLUE SHIELD | Admitting: Obstetrics and Gynecology

## 2017-10-24 ENCOUNTER — Encounter: Payer: BLUE CROSS/BLUE SHIELD | Admitting: Certified Nurse Midwife

## 2018-02-13 ENCOUNTER — Other Ambulatory Visit: Payer: Self-pay | Admitting: Obstetrics and Gynecology

## 2018-02-13 ENCOUNTER — Telehealth: Payer: Self-pay | Admitting: Obstetrics and Gynecology

## 2018-02-13 DIAGNOSIS — R921 Mammographic calcification found on diagnostic imaging of breast: Secondary | ICD-10-CM

## 2018-02-13 NOTE — Telephone Encounter (Signed)
Patient called requesting an order be placed for her 6 month follow-up mammogram with [facility].  Please advise, thank you.

## 2018-02-13 NOTE — Telephone Encounter (Signed)
Done-ac 

## 2018-02-17 ENCOUNTER — Other Ambulatory Visit: Payer: Self-pay | Admitting: Obstetrics and Gynecology

## 2018-02-17 DIAGNOSIS — R921 Mammographic calcification found on diagnostic imaging of breast: Secondary | ICD-10-CM

## 2018-02-21 ENCOUNTER — Other Ambulatory Visit: Payer: Self-pay | Admitting: Obstetrics and Gynecology

## 2018-02-21 DIAGNOSIS — R921 Mammographic calcification found on diagnostic imaging of breast: Secondary | ICD-10-CM

## 2018-02-23 ENCOUNTER — Other Ambulatory Visit: Payer: Self-pay

## 2018-02-23 DIAGNOSIS — R921 Mammographic calcification found on diagnostic imaging of breast: Secondary | ICD-10-CM

## 2018-03-10 ENCOUNTER — Ambulatory Visit
Admission: RE | Admit: 2018-03-10 | Discharge: 2018-03-10 | Disposition: A | Discharge status: Home / Self Care | Payer: BLUE CROSS/BLUE SHIELD | Source: Ambulatory Visit | Attending: Obstetrics and Gynecology | Admitting: Obstetrics and Gynecology

## 2018-03-10 DIAGNOSIS — R921 Mammographic calcification found on diagnostic imaging of breast: Secondary | ICD-10-CM

## 2018-04-21 ENCOUNTER — Encounter: Payer: BLUE CROSS/BLUE SHIELD | Admitting: Obstetrics and Gynecology

## 2018-05-19 ENCOUNTER — Encounter: Payer: BLUE CROSS/BLUE SHIELD | Admitting: Obstetrics and Gynecology

## 2018-05-24 ENCOUNTER — Encounter: Payer: Self-pay | Admitting: Obstetrics and Gynecology

## 2018-05-24 ENCOUNTER — Ambulatory Visit (INDEPENDENT_AMBULATORY_CARE_PROVIDER_SITE_OTHER): Payer: BLUE CROSS/BLUE SHIELD | Admitting: Obstetrics and Gynecology

## 2018-05-24 VITALS — BP 135/104 | HR 64 | Ht 64.0 in | Wt 134.0 lb

## 2018-05-24 DIAGNOSIS — Z01419 Encounter for gynecological examination (general) (routine) without abnormal findings: Secondary | ICD-10-CM | POA: Diagnosis not present

## 2018-05-24 DIAGNOSIS — Z1211 Encounter for screening for malignant neoplasm of colon: Secondary | ICD-10-CM | POA: Diagnosis not present

## 2018-05-24 NOTE — Patient Instructions (Signed)

## 2018-05-24 NOTE — Progress Notes (Signed)
Subjective:   Julie Washington is a 52 y.o. G65P2 Caucasian female here for a routine well-woman exam.  No LMP recorded. Patient has had a hysterectomy.    Current complaints: none PCP: Ronnald Ramp       does desire labs  Social History: Sexual: heterosexual Marital Status: married Living situation: with family Occupation: in home daycare Tobacco/alcohol: no tobacco use Illicit drugs: no history of illicit drug use  The following portions of the patient's history were reviewed and updated as appropriate: allergies, current medications, past family history, past medical history, past social history, past surgical history and problem list.  Past Medical History Past Medical History:  Diagnosis Date  . Allergy     Past Surgical History Past Surgical History:  Procedure Laterality Date  . BREAST EXCISIONAL BIOPSY Left 2015    cyst  . VAGINAL HYSTERECTOMY      Gynecologic History G2P2  No LMP recorded. Patient has had a hysterectomy. Contraception: status post hysterectomy Last Pap: 2018. Results were: normal Last mammogram: 03/2018. Results were: normal   Obstetric History OB History  Gravida Para Term Preterm AB Living  2 2          SAB TAB Ectopic Multiple Live Births               # Outcome Date GA Lbr Len/2nd Weight Sex Delivery Anes PTL Lv  2 Para 1989    M Vag-Spont     1 Para 1988    F Vag-Spont       Current Medications Current Outpatient Medications on File Prior to Visit  Medication Sig Dispense Refill  . albuterol (PROVENTIL HFA;VENTOLIN HFA) 108 (90 Base) MCG/ACT inhaler Inhale 2 puffs into the lungs every 4 (four) hours as needed for wheezing. 1 Inhaler 0  . azelastine (OPTIVAR) 0.05 % ophthalmic solution     . clobetasol ointment (TEMOVATE) 0.05 % 2 x a day for 2 weeks then daily x 4 weeks 30 g 1  . fluticasone (FLONASE) 50 MCG/ACT nasal spray Place into both nostrils daily.    Marland Kitchen ibuprofen (ADVIL,MOTRIN) 400 MG tablet Take 400 mg by mouth every 6 (six) hours as  needed.     No current facility-administered medications on file prior to visit.     Review of Systems Patient denies any headaches, blurred vision, shortness of breath, chest pain, abdominal pain, problems with bowel movements, urination, or intercourse.  Objective:  BP (!) 135/104   Pulse 64   Ht 5\' 4"  (1.626 m)   Wt 134 lb (60.8 kg)   BMI 23.00 kg/m  Physical Exam  General:  Well developed, well nourished, no acute distress. She is alert and oriented x3. Skin:  Warm and dry Neck:  Midline trachea, no thyromegaly or nodules Cardiovascular: Regular rate and rhythm, no murmur heard Lungs:  Effort normal, all lung fields clear to auscultation bilaterally Breasts:  No dominant palpable mass, retraction, or nipple discharge Abdomen:  Soft, non tender, no hepatosplenomegaly or masses Pelvic:  External genitalia is normal in appearance.  The vagina is normal in appearance. The cervix is bulbous, no CMT.  Thin prep pap is not done Uterus is surgically absent.  No adnexal masses or tenderness noted. Extremities:  No swelling or varicosities noted Psych:  She has a normal mood and affect  Assessment:   Healthy well-woman exam Needs flu vaccine Needs screening colonoscopy  Plan:  Declined flu vaccine F/U 1 year for AE, or sooner if needed  Colonoscopy referral placed- has  never had one.   Romilda Proby Rockney Ghee, CNM

## 2018-05-24 NOTE — Progress Notes (Signed)
Pt presents today for her annual exam. Pt states she has no concerns.

## 2018-05-25 ENCOUNTER — Other Ambulatory Visit: Payer: Self-pay

## 2018-05-25 DIAGNOSIS — Z1211 Encounter for screening for malignant neoplasm of colon: Secondary | ICD-10-CM

## 2018-06-21 ENCOUNTER — Telehealth: Payer: Self-pay | Admitting: Obstetrics and Gynecology

## 2018-06-21 ENCOUNTER — Other Ambulatory Visit: Payer: Self-pay | Admitting: *Deleted

## 2018-06-21 MED ORDER — ALBUTEROL SULFATE HFA 108 (90 BASE) MCG/ACT IN AERS: 2.0000 | INHALATION_SPRAY | RESPIRATORY_TRACT | 3 refills | Status: AC | PRN

## 2018-06-21 NOTE — Telephone Encounter (Signed)
Patient called requesting a refill on albuterol. Thanks

## 2018-06-21 NOTE — Telephone Encounter (Signed)
Done-ac 

## 2018-06-21 NOTE — Telephone Encounter (Signed)
The patient called and stated that she needs a medication refill for her inhaler, No other information was disclosed. Please advise.

## 2018-06-22 NOTE — Telephone Encounter (Signed)
Done-ac 

## 2018-06-25 ENCOUNTER — Ambulatory Visit
Admission: EM | Admit: 2018-06-25 | Discharge: 2018-06-25 | Disposition: A | Discharge status: Home / Self Care | Payer: BLUE CROSS/BLUE SHIELD | Attending: Emergency Medicine | Admitting: Emergency Medicine

## 2018-06-25 ENCOUNTER — Other Ambulatory Visit: Payer: Self-pay

## 2018-06-25 ENCOUNTER — Ambulatory Visit (INDEPENDENT_AMBULATORY_CARE_PROVIDER_SITE_OTHER): Payer: BLUE CROSS/BLUE SHIELD

## 2018-06-25 DIAGNOSIS — J22 Unspecified acute lower respiratory infection: Secondary | ICD-10-CM | POA: Diagnosis not present

## 2018-06-25 DIAGNOSIS — J4521 Mild intermittent asthma with (acute) exacerbation: Secondary | ICD-10-CM | POA: Diagnosis not present

## 2018-06-25 DIAGNOSIS — R05 Cough: Secondary | ICD-10-CM

## 2018-06-25 DIAGNOSIS — R51 Headache: Secondary | ICD-10-CM | POA: Diagnosis not present

## 2018-06-25 DIAGNOSIS — R0989 Other specified symptoms and signs involving the circulatory and respiratory systems: Secondary | ICD-10-CM | POA: Diagnosis not present

## 2018-06-25 DIAGNOSIS — R5383 Other fatigue: Secondary | ICD-10-CM

## 2018-06-25 HISTORY — DX: Exercise induced bronchospasm: J45.990

## 2018-06-25 MED ORDER — AEROCHAMBER PLUS MISC: 2 refills | Status: AC

## 2018-06-25 MED ORDER — HYDROCOD POLST-CPM POLST ER 10-8 MG/5ML PO SUER: 5.0000 mL | Freq: Two times a day (BID) | ORAL | 0 refills | Status: DC | PRN

## 2018-06-25 MED ORDER — PREDNISONE 10 MG (21) PO TBPK: ORAL_TABLET | ORAL | 0 refills | Status: DC

## 2018-06-25 MED ORDER — ALBUTEROL SULFATE HFA 108 (90 BASE) MCG/ACT IN AERS: 1.0000 | INHALATION_SPRAY | Freq: Four times a day (QID) | RESPIRATORY_TRACT | 0 refills | Status: DC | PRN

## 2018-06-25 MED ORDER — IPRATROPIUM-ALBUTEROL 0.5-2.5 (3) MG/3ML IN SOLN
3.0000 mL | Freq: Once | RESPIRATORY_TRACT | Status: AC
  Administered 2018-06-25: 3 mL via RESPIRATORY_TRACT

## 2018-06-25 MED ORDER — BENZONATATE 200 MG PO CAPS: 200.0000 mg | ORAL_CAPSULE | Freq: Three times a day (TID) | ORAL | 0 refills | Status: DC | PRN

## 2018-06-25 NOTE — ED Provider Notes (Signed)
HPI  SUBJECTIVE:  Julie Washington is a 52 y.o. female who presents with nonproductive cough for 1 week.  She states this started off as a cough and sore throat, which has resolved.  She states the cough is now "gone into my chest".  She reports wheezing, fatigue, headache secondary to the cough.  States that she is unable to sleep at night because of the cough.  She denies ever having nasal congestion, rhinorrhea, postnasal drip, sinus pain or pressure.  She states that overall she is getting worse.  No chest pain, chest tightness, shortness of breath, dyspnea on exertion, fevers.  No allergy or GERD symptoms.  No antibiotics in the past month.  No antipyretic in the past 4 to 6 hours.  She tried Mucinex, Robitussin, Advil, and several puffs from her albuterol inhaler without improvement in her symptoms.  Symptoms are worse with lying down at night.  She does not have a spacer.  She has a past medical history of asthma.  No history of diabetes, hypertension, GERD, allergies.  VVO:HYWVP, Iven Finn, MD   Past Medical History:  Diagnosis Date  . Allergy   . Exercise-induced asthma     Past Surgical History:  Procedure Laterality Date  . BREAST EXCISIONAL BIOPSY Left 2015    cyst  . VAGINAL HYSTERECTOMY      Family History  Problem Relation Age of Onset  . Diabetes Mother   . Diabetes Father   . Heart disease Father   . Cancer Maternal Grandmother   . Stroke Maternal Grandmother   . Heart disease Paternal Grandfather   . Breast cancer Other     Social History   Tobacco Use  . Smoking status: Former Smoker    Types: Cigarettes  . Smokeless tobacco: Never Used  Substance Use Topics  . Alcohol use: Yes    Alcohol/week: 0.0 standard drinks    Comment: social  . Drug use: No    No current facility-administered medications for this encounter.   Current Outpatient Medications:  .  albuterol (PROVENTIL HFA;VENTOLIN HFA) 108 (90 Base) MCG/ACT inhaler, Inhale 2 puffs into the lungs every  4 (four) hours as needed for wheezing., Disp: 1 Inhaler, Rfl: 3 .  albuterol (PROVENTIL HFA;VENTOLIN HFA) 108 (90 Base) MCG/ACT inhaler, Inhale 1-2 puffs into the lungs every 6 (six) hours as needed for wheezing or shortness of breath., Disp: 1 Inhaler, Rfl: 0 .  azelastine (OPTIVAR) 0.05 % ophthalmic solution, , Disp: , Rfl:  .  benzonatate (TESSALON) 200 MG capsule, Take 1 capsule (200 mg total) by mouth 3 (three) times daily as needed for cough., Disp: 30 capsule, Rfl: 0 .  chlorpheniramine-HYDROcodone (TUSSIONEX PENNKINETIC ER) 10-8 MG/5ML SUER, Take 5 mLs by mouth every 12 (twelve) hours as needed for cough., Disp: 60 mL, Rfl: 0 .  clobetasol ointment (TEMOVATE) 0.05 %, 2 x a day for 2 weeks then daily x 4 weeks, Disp: 30 g, Rfl: 1 .  ibuprofen (ADVIL,MOTRIN) 400 MG tablet, Take 400 mg by mouth every 6 (six) hours as needed., Disp: , Rfl:  .  predniSONE (STERAPRED UNI-PAK 21 TAB) 10 MG (21) TBPK tablet, Dispense one 6 day pack. Take as directed with food., Disp: 21 tablet, Rfl: 0 .  Spacer/Aero-Holding Chambers (AEROCHAMBER PLUS) inhaler, Use as instructed, Disp: 1 each, Rfl: 2  No Known Allergies   ROS  As noted in HPI.   Physical Exam  BP 129/89 (BP Location: Right Arm)   Pulse 82   Temp 98.5  F (36.9 C) (Oral)   Resp 18   Ht 5\' 5"  (1.651 m)   Wt 61.2 kg   SpO2 97%   BMI 22.47 kg/m   Constitutional: Well developed, well nourished, no acute distress Eyes:  EOMI, conjunctiva normal bilaterally HENT: Normocephalic, atraumatic,mucus membranes moist.  No nasal congestion.  Normal turbinates.  No sinus tenderness.  No cobblestoning, postnasal drip. Respiratory: Normal inspiratory effort, rales and rhonchi left lower lobe.  Good air movement. Cardiovascular: Normal rate, regular rhythm, no murmurs, rubs, gallops. GI: nondistended skin: No rash, skin intact Musculoskeletal: no deformities Neurologic: Alert & oriented x 3, no focal neuro deficits Psychiatric: Speech and behavior  appropriate   ED Course   Medications  ipratropium-albuterol (DUONEB) 0.5-2.5 (3) MG/3ML nebulizer solution 3 mL (3 mLs Nebulization Given 06/25/18 1110)    Orders Placed This Encounter  Procedures  . DG Chest 2 View    Standing Status:   Standing    Number of Occurrences:   1    Order Specific Question:   Reason for Exam (SYMPTOM  OR DIAGNOSIS REQUIRED)    Answer:   rales ronchi LLL r/o PNA    No results found for this or any previous visit (from the past 24 hour(s)). Dg Chest 2 View  Result Date: 06/25/2018 CLINICAL DATA:  Left lower lobe rales.  Cough and congestion. EXAM: CHEST - 2 VIEW COMPARISON:  None. FINDINGS: Cardiomediastinal silhouette is normal. Mediastinal contours appear intact. There is no evidence of focal airspace consolidation, pleural effusion or pneumothorax. Osseous structures are without acute abnormality. Soft tissues are grossly normal. IMPRESSION: No active cardiopulmonary disease. Electronically Signed   By: Fidela Salisbury M.D.   On: 06/25/2018 12:16    ED Clinical Impression  LRTI (lower respiratory tract infection)  Mild intermittent asthma with acute exacerbation   ED Assessment/Plan  We will give DuoNeb and reevaluate.  Will check chest x-ray to rule out pneumonia.  If positive for pneumonia, will send home with azithromycin in addition to a 6-day prednisone taper, and albuterol inhaler with a spacer, Tussionex, Tessalon.  If negative, we have decided to wait and see how she responds to these medications.  She will follow-up here or with her primary care physician in several days if not getting better and we can consider antibiotics at that time.  She is to go to the ER if she gets worse.  [place] Narcotic database reviewed for this patient, and feel that the risk/benefit ratio today is favorable for proceeding with a prescription for controlled substance.  Last opiate prescription in January 2018 for Tussionex  Reviewed imaging independently.  No  pneumonia per my read.  Radiology agrees.  See radiology report for full details.  Reevaluation post DuoNeb, patient states that she feels better.  Continued rhonchi left lower lobe, significantly improved air movement.  Plan as above.  Discussed imaging, MDM, treatment plan, and plan for follow-up with patient. Discussed sn/sx that should prompt return to the ED. patient agrees with plan.   Meds ordered this encounter  Medications  . ipratropium-albuterol (DUONEB) 0.5-2.5 (3) MG/3ML nebulizer solution 3 mL  . Spacer/Aero-Holding Chambers (AEROCHAMBER PLUS) inhaler    Sig: Use as instructed    Dispense:  1 each    Refill:  2  . benzonatate (TESSALON) 200 MG capsule    Sig: Take 1 capsule (200 mg total) by mouth 3 (three) times daily as needed for cough.    Dispense:  30 capsule    Refill:  0  .  chlorpheniramine-HYDROcodone (TUSSIONEX PENNKINETIC ER) 10-8 MG/5ML SUER    Sig: Take 5 mLs by mouth every 12 (twelve) hours as needed for cough.    Dispense:  60 mL    Refill:  0  . albuterol (PROVENTIL HFA;VENTOLIN HFA) 108 (90 Base) MCG/ACT inhaler    Sig: Inhale 1-2 puffs into the lungs every 6 (six) hours as needed for wheezing or shortness of breath.    Dispense:  1 Inhaler    Refill:  0  . predniSONE (STERAPRED UNI-PAK 21 TAB) 10 MG (21) TBPK tablet    Sig: Dispense one 6 day pack. Take as directed with food.    Dispense:  21 tablet    Refill:  0    *This clinic note was created using [employer]. Therefore, there may be occasional mistakes despite careful proofreading.   ?   Melynda Ripple, MD 06/25/18 1739

## 2018-06-25 NOTE — Discharge Instructions (Addendum)
I personally did not see a pneumonia on your x-ray, however, the radiologist read is still pending.  I or my staff will notify you and will call in a prescription of antibiotics if it is read as having a pneumonia.  2 puffs from your albuterol inhaler every 4-6 hours as needed for coughing, wheezing.  Use your spacer.  Tessalon for the cough during the day, Tussionex for the cough at night.  Finish the prednisone, even if you feel better.  Follow-up here with your doctor in several days if you are not getting better with steroids and albuterol, we can consider starting antibiotics at that time.  Go to the ER for the signs and symptoms we discussed

## 2018-06-25 NOTE — ED Triage Notes (Signed)
Pt states she travels a lot and just returned from [place] a week ago. Had a cough that has moved into her chest and has been wheezing with non productive cough.

## 2018-07-06 ENCOUNTER — Encounter: Payer: Self-pay | Admitting: *Deleted

## 2018-07-06 ENCOUNTER — Other Ambulatory Visit: Payer: Self-pay

## 2018-07-13 NOTE — Discharge Instructions (Signed)
General Anesthesia, Adult, Care After °These instructions provide you with information about caring for yourself after your procedure. Your health care provider may also give you more specific instructions. Your treatment has been planned according to current medical practices, but problems sometimes occur. Call your health care provider if you have any problems or questions after your procedure. °What can I expect after the procedure? °After the procedure, it is common to have: °· Vomiting. °· A sore throat. °· Mental slowness. ° °It is common to feel: °· Nauseous. °· Cold or shivery. °· Sleepy. °· Tired. °· Sore or achy, even in parts of your body where you did not have surgery. ° °Follow these instructions at home: °For at least 24 hours after the procedure: °· Do not: °? Participate in activities where you could fall or become injured. °? Drive. °? Use heavy machinery. °? Drink alcohol. °? Take sleeping pills or medicines that cause drowsiness. °? Make important decisions or sign legal documents. °? Take care of children on your own. °· Rest. °Eating and drinking °· If you vomit, drink water, juice, or soup when you can drink without vomiting. °· Drink enough fluid to keep your urine clear or pale yellow. °· Make sure you have little or no nausea before eating solid foods. °· Follow the diet recommended by your health care provider. °General instructions °· Have a responsible adult stay with you until you are awake and alert. °· Return to your normal activities as told by your health care provider. Ask your health care provider what activities are safe for you. °· Take over-the-counter and prescription medicines only as told by your health care provider. °· If you smoke, do not smoke without supervision. °· Keep all follow-up visits as told by your health care provider. This is important. °Contact a health care provider if: °· You continue to have nausea or vomiting at home, and medicines are not helpful. °· You  cannot drink fluids or start eating again. °· You cannot urinate after 8-12 hours. °· You develop a skin rash. °· You have fever. °· You have increasing redness at the site of your procedure. °Get help right away if: °· You have difficulty breathing. °· You have chest pain. °· You have unexpected bleeding. °· You feel that you are having a life-threatening or urgent problem. °This information is not intended to replace advice given to you by your health care provider. Make sure you discuss any questions you have with your health care provider. °Document Released: 11/29/2000 Document Revised: 01/26/2016 Document Reviewed: 08/07/2015 °Elsevier Interactive Patient Education © 2018 [employer] ° °

## 2018-07-14 ENCOUNTER — Ambulatory Visit: Payer: BLUE CROSS/BLUE SHIELD | Admitting: Anesthesiology

## 2018-07-14 ENCOUNTER — Ambulatory Visit
Admission: RE | Admit: 2018-07-14 | Discharge: 2018-07-14 | Disposition: A | Discharge status: Home / Self Care | Payer: BLUE CROSS/BLUE SHIELD | Source: Ambulatory Visit | Attending: Gastroenterology | Admitting: Gastroenterology

## 2018-07-14 ENCOUNTER — Encounter
Admission: RE | Disposition: A | Discharge status: Home / Self Care | Payer: Self-pay | Source: Ambulatory Visit | Attending: Gastroenterology

## 2018-07-14 DIAGNOSIS — Z87891 Personal history of nicotine dependence: Secondary | ICD-10-CM | POA: Insufficient documentation

## 2018-07-14 DIAGNOSIS — Z8249 Family history of ischemic heart disease and other diseases of the circulatory system: Secondary | ICD-10-CM | POA: Insufficient documentation

## 2018-07-14 DIAGNOSIS — D125 Benign neoplasm of sigmoid colon: Secondary | ICD-10-CM | POA: Insufficient documentation

## 2018-07-14 DIAGNOSIS — J45909 Unspecified asthma, uncomplicated: Secondary | ICD-10-CM | POA: Diagnosis not present

## 2018-07-14 DIAGNOSIS — Z1211 Encounter for screening for malignant neoplasm of colon: Secondary | ICD-10-CM | POA: Insufficient documentation

## 2018-07-14 DIAGNOSIS — K635 Polyp of colon: Secondary | ICD-10-CM

## 2018-07-14 DIAGNOSIS — K5730 Diverticulosis of large intestine without perforation or abscess without bleeding: Secondary | ICD-10-CM | POA: Insufficient documentation

## 2018-07-14 DIAGNOSIS — K640 First degree hemorrhoids: Secondary | ICD-10-CM | POA: Insufficient documentation

## 2018-07-14 HISTORY — PX: COLONOSCOPY WITH PROPOFOL: SHX5780

## 2018-07-14 HISTORY — PX: POLYPECTOMY: SHX5525

## 2018-07-14 SURGERY — COLONOSCOPY WITH PROPOFOL
Anesthesia: General | Site: Rectum

## 2018-07-14 MED ORDER — LACTATED RINGERS IV SOLN
INTRAVENOUS | Status: DC
  Administered 2018-07-14: 09:00:00 via INTRAVENOUS

## 2018-07-14 MED ORDER — ACETAMINOPHEN 325 MG PO TABS: 325.0000 mg | ORAL_TABLET | Freq: Once | ORAL | Status: DC

## 2018-07-14 MED ORDER — PROPOFOL 10 MG/ML IV BOLUS
INTRAVENOUS | Status: DC | PRN
  Administered 2018-07-14: 30 mg via INTRAVENOUS
  Administered 2018-07-14 (×2): 40 mg via INTRAVENOUS
  Administered 2018-07-14: 20 mg via INTRAVENOUS
  Administered 2018-07-14: 40 mg via INTRAVENOUS
  Administered 2018-07-14: 100 mg via INTRAVENOUS

## 2018-07-14 MED ORDER — ACETAMINOPHEN 160 MG/5ML PO SOLN: 325.0000 mg | Freq: Once | ORAL | Status: DC

## 2018-07-14 MED ORDER — STERILE WATER FOR IRRIGATION IR SOLN
Status: DC | PRN
  Administered 2018-07-14: 11:00:00

## 2018-07-14 MED ORDER — LIDOCAINE HCL (CARDIAC) PF 100 MG/5ML IV SOSY
PREFILLED_SYRINGE | INTRAVENOUS | Status: DC | PRN
  Administered 2018-07-14: 30 mg via INTRAVENOUS

## 2018-07-14 MED ORDER — SODIUM CHLORIDE 0.9 % IV SOLN: INTRAVENOUS | Status: DC

## 2018-07-14 SURGICAL SUPPLY — 16 items
CANISTER SUCT 1200ML W/VALVE (MISCELLANEOUS) ×4 IMPLANT
CLIP HMST 235XBRD CATH ROT (MISCELLANEOUS) IMPLANT
CLIP RESOLUTION 360 11X235 (MISCELLANEOUS)
ELECT REM PT RETURN 9FT ADLT (ELECTROSURGICAL)
ELECTRODE REM PT RTRN 9FT ADLT (ELECTROSURGICAL) IMPLANT
FORCEPS BIOP RAD 4 LRG CAP 4 (CUTTING FORCEPS) IMPLANT
GOWN CVR UNV OPN BCK APRN NK (MISCELLANEOUS) ×4 IMPLANT
GOWN ISOL THUMB LOOP REG UNIV (MISCELLANEOUS) ×8
INJECTOR VARIJECT VIN23 (MISCELLANEOUS) IMPLANT
KIT ENDO PROCEDURE OLY (KITS) ×4 IMPLANT
MARKER SPOT ENDO TATTOO 5ML (MISCELLANEOUS) IMPLANT
SNARE SHORT THROW 13M SML OVAL (MISCELLANEOUS) ×2 IMPLANT
SPOT EX ENDOSCOPIC TATTOO (MISCELLANEOUS)
TRAP ETRAP POLY (MISCELLANEOUS) ×2 IMPLANT
VARIJECT INJECTOR VIN23 (MISCELLANEOUS)
WATER STERILE IRR 250ML POUR (IV SOLUTION) ×4 IMPLANT

## 2018-07-14 NOTE — Op Note (Signed)
[facility] Gastroenterology Patient Name: Julie Washington Procedure Date: 07/14/2018 10:27 AM MRN: [id number] Account #: [id number] Date of Birth: 1966/08/03 Admit Type: Outpatient Age: 52 Room: [facility] OR ROOM 01 Gender: Female Note Status: Finalized Procedure:            Colonoscopy Indications:          Screening for colorectal malignant neoplasm Providers:            Lucilla Lame MD, MD Referring MD:         Juline Patch, MD (Referring MD) Medicines:            Propofol per Anesthesia Complications:        No immediate complications. Procedure:            Pre-Anesthesia Assessment:                       - Prior to the procedure, a History and Physical was                        performed, and patient medications and allergies were                        reviewed. The patient's tolerance of previous                        anesthesia was also reviewed. The risks and benefits of                        the procedure and the sedation options and risks were                        discussed with the patient. All questions were                        answered, and informed consent was obtained. Prior                        Anticoagulants: The patient has taken no previous                        anticoagulant or antiplatelet agents. ASA Grade                        Assessment: II - A patient with mild systemic disease.                        After reviewing the risks and benefits, the patient was                        deemed in satisfactory condition to undergo the                        procedure.                       After obtaining informed consent, the colonoscope was                        passed under direct vision. Throughout the procedure,  the patient's blood pressure, pulse, and oxygen                        saturations were monitored continuously. The was                        introduced through the anus and advanced to the the                 cecum, identified by appendiceal orifice and ileocecal                        valve. The colonoscopy was performed without                        difficulty. The patient tolerated the procedure well.                        The quality of the bowel preparation was excellent. Findings:      The perianal and digital rectal examinations were normal.      A 4 mm polyp was found in the sigmoid colon. The polyp was sessile. The       polyp was removed with a cold snare. Resection and retrieval were       complete.      A few small-mouthed diverticula were found in the sigmoid colon and       descending colon.      Non-bleeding internal hemorrhoids were found during retroflexion. The       hemorrhoids were Grade I (internal hemorrhoids that do not prolapse). Impression:           - One 4 mm polyp in the sigmoid colon, removed with a                        cold snare. Resected and retrieved.                       - Diverticulosis in the sigmoid colon and in the                        descending colon.                       - Non-bleeding internal hemorrhoids. Recommendation:       - Discharge patient to home.                       - Resume previous diet.                       - Continue present medications.                       - Await pathology results.                       - Repeat colonoscopy in 5 years if polyp adenoma and 10                        years if hyperplastic Procedure Code(s):    --- Professional ---  45385, Colonoscopy, flexible; with removal of tumor(s),                        polyp(s), or other lesion(s) by snare technique Diagnosis Code(s):    --- Professional ---                       Z12.11, Encounter for screening for malignant neoplasm                        of colon                       D12.5, Benign neoplasm of sigmoid colon CPT copyright 2018 American [employer]. All rights reserved. The codes documented in this report  are preliminary and upon coder review may  be revised to meet current compliance requirements. Lucilla Lame MD, MD 07/14/2018 10:54:41 AM This report has been signed electronically. Number of Addenda: 0 Note Initiated On: 07/14/2018 10:27 AM Scope Withdrawal Time: 0 hours 6 minutes 34 seconds  Total Procedure Duration: 0 hours 14 minutes 58 seconds       [facility]

## 2018-07-14 NOTE — Transfer of Care (Signed)
Immediate Anesthesia Transfer of Care Note  Patient: Julie Washington  Procedure(s) Performed: COLONOSCOPY WITH PROPOFOL (N/A ) POLYPECTOMY (Rectum)  Patient Location: PACU  Anesthesia Type: General  Level of Consciousness: awake, alert  and patient cooperative  Airway and Oxygen Therapy: Patient Spontanous Breathing and Patient connected to supplemental oxygen  Post-op Assessment: Post-op Vital signs reviewed, Patient's Cardiovascular Status Stable, Respiratory Function Stable, Patent Airway and No signs of Nausea or vomiting  Post-op Vital Signs: Reviewed and stable  Complications: No apparent anesthesia complications

## 2018-07-14 NOTE — Anesthesia Procedure Notes (Signed)
Date/Time: 07/14/2018 10:32 AM Performed by: Cameron Ali, CRNA Pre-anesthesia Checklist: Patient identified, Emergency Drugs available, Suction available, Timeout performed and Patient being monitored Patient Re-evaluated:Patient Re-evaluated prior to induction Oxygen Delivery Method: Nasal cannula Placement Confirmation: positive ETCO2

## 2018-07-14 NOTE — Anesthesia Preprocedure Evaluation (Signed)
Anesthesia Evaluation  Patient identified by MRN, date of birth, ID band Patient awake    Reviewed: Allergy & Precautions, H&P , NPO status , Patient's Chart, lab work & pertinent test results  Airway Mallampati: II  TM Distance: >3 FB Neck ROM: full    Dental no notable dental hx.    Pulmonary asthma , former smoker,    Pulmonary exam normal breath sounds clear to auscultation       Cardiovascular Normal cardiovascular exam Rhythm:regular Rate:Normal     Neuro/Psych    GI/Hepatic   Endo/Other    Renal/GU      Musculoskeletal   Abdominal   Peds  Hematology   Anesthesia Other Findings   Reproductive/Obstetrics                             Anesthesia Physical Anesthesia Plan  ASA: II  Anesthesia Plan: General   Post-op Pain Management:    Induction: Intravenous  PONV Risk Score and Plan: 3 and Propofol infusion and Treatment may vary due to age or medical condition  Airway Management Planned: Natural Airway  Additional Equipment:   Intra-op Plan:   Post-operative Plan:   Informed Consent: I have reviewed the patients History and Physical, chart, labs and discussed the procedure including the risks, benefits and alternatives for the proposed anesthesia with the patient or authorized representative who has indicated his/her understanding and acceptance.     Plan Discussed with: CRNA  Anesthesia Plan Comments:         Anesthesia Quick Evaluation

## 2018-07-14 NOTE — H&P (Signed)
Lucilla Lame, MD [address] [place], [place] [address] Phone: [phone] Fax : [phone]  Primary Care Physician:  Juline Patch, MD Primary Gastroenterologist:  Dr. Allen Norris  Pre-Procedure History & Physical: HPI:  Julie Washington is a 52 y.o. female is here for a screening colonoscopy.   Past Medical History:  Diagnosis Date  . Allergy   . Exercise-induced asthma     Past Surgical History:  Procedure Laterality Date  . BREAST EXCISIONAL BIOPSY Left 2015    cyst  . VAGINAL HYSTERECTOMY      Prior to Admission medications   Medication Sig Start Date End Date Taking? Authorizing Provider  albuterol (PROVENTIL HFA;VENTOLIN HFA) 108 (90 Base) MCG/ACT inhaler Inhale 2 puffs into the lungs every 4 (four) hours as needed for wheezing. 06/21/18  Yes Shambley, Melody N, CNM  azelastine (OPTIVAR) 0.05 % ophthalmic solution    Yes [provider]  ibuprofen (ADVIL,MOTRIN) 400 MG tablet Take 400 mg by mouth every 6 (six) hours as needed.   Yes [provider]  benzonatate (TESSALON) 200 MG capsule Take 1 capsule (200 mg total) by mouth 3 (three) times daily as needed for cough. Patient not taking: Reported on 07/06/2018 06/25/18   Melynda Ripple, MD  chlorpheniramine-HYDROcodone [facility]) 10-8 MG/5ML SUER Take 5 mLs by mouth every 12 (twelve) hours as needed for cough. Patient not taking: Reported on 07/06/2018 06/25/18   Melynda Ripple, MD  clobetasol ointment (TEMOVATE) 0.05 % 2 x a day for 2 weeks then daily x 4 weeks Patient not taking: Reported on 07/06/2018 08/29/17   Diona Fanti, CNM  Spacer/Aero-Holding Chambers (AEROCHAMBER PLUS) inhaler Use as instructed 06/25/18   Melynda Ripple, MD    Allergies as of 05/25/2018  . (No Known Allergies)    Family History  Problem Relation Age of Onset  . Diabetes Mother   . Diabetes Father   . Heart disease Father   . Cancer Maternal Grandmother   . Stroke  Maternal Grandmother   . Heart disease Paternal Grandfather   . Breast cancer Other     Social History   Socioeconomic History  . Marital status: Married    Spouse name: Not on file  . Number of children: Not on file  . Years of education: Not on file  . Highest education level: Not on file  Occupational History  . Not on file  Social Needs  . Financial resource strain: Not on file  . Food insecurity:    Worry: Not on file    Inability: Not on file  . Transportation needs:    Medical: Not on file    Non-medical: Not on file  Tobacco Use  . Smoking status: Former Smoker    Types: Cigarettes    Last attempt to quit: 1999    Years since quitting: 20.8  . Smokeless tobacco: Never Used  Substance and Sexual Activity  . Alcohol use: Yes    Alcohol/week: 3.0 standard drinks    Types: 3 Glasses of wine per week    Comment: social  . Drug use: No  . Sexual activity: Yes    Birth control/protection: Surgical  Lifestyle  . Physical activity:    Days per week: Not on file    Minutes per session: Not on file  . Stress: Not on file  Relationships  . Social connections:    Talks on phone: Not on file    Gets together: Not on file  Attends religious service: Not on file    Active member of club or organization: Not on file    Attends meetings of clubs or organizations: Not on file    Relationship status: Not on file  . Intimate partner violence:    Fear of current or ex partner: Not on file    Emotionally abused: Not on file    Physically abused: Not on file    Forced sexual activity: Not on file  Other Topics Concern  . Not on file  Social History Narrative  . Not on file    Review of Systems: See HPI, otherwise negative ROS  Physical Exam: BP 122/81   Pulse 86   Temp 97.9 F (36.6 C) (Temporal)   Resp 17   Ht 5\' 5"  (1.651 m)   Wt 60.3 kg   SpO2 98%   BMI 22.13 kg/m  General:   Alert,  pleasant and cooperative in NAD Head:  Normocephalic and  atraumatic. Neck:  Supple; no masses or thyromegaly. Lungs:  Clear throughout to auscultation.    Heart:  Regular rate and rhythm. Abdomen:  Soft, nontender and nondistended. Normal bowel sounds, without guarding, and without rebound.   Neurologic:  Alert and  oriented x4;  grossly normal neurologically.  Impression/Plan: Julie Washington is now here to undergo a screening colonoscopy.  Risks, benefits, and alternatives regarding colonoscopy have been reviewed with the patient.  Questions have been answered.  All parties agreeable.

## 2018-07-14 NOTE — Anesthesia Postprocedure Evaluation (Signed)
Anesthesia Post Note  Patient: Julie Washington  Procedure(s) Performed: COLONOSCOPY WITH PROPOFOL (N/A ) POLYPECTOMY (Rectum)  Patient location during evaluation: PACU Anesthesia Type: General Level of consciousness: awake and alert and oriented Pain management: satisfactory to patient Vital Signs Assessment: post-procedure vital signs reviewed and stable Respiratory status: spontaneous breathing, nonlabored ventilation and respiratory function stable Cardiovascular status: blood pressure returned to baseline and stable Postop Assessment: Adequate PO intake and No signs of nausea or vomiting Anesthetic complications: no    Raliegh Ip

## 2018-07-17 ENCOUNTER — Encounter: Payer: Self-pay | Admitting: Gastroenterology

## 2018-07-18 ENCOUNTER — Encounter: Payer: Self-pay | Admitting: Gastroenterology

## 2018-07-19 ENCOUNTER — Encounter: Payer: Self-pay | Admitting: Gastroenterology

## 2019-01-30 ENCOUNTER — Telehealth: Payer: Self-pay

## 2019-01-30 NOTE — Telephone Encounter (Signed)
Coronavirus (COVID-19) Are you at risk?  Are you at risk for the Coronavirus (COVID-19)?  To be considered HIGH RISK for Coronavirus (COVID-19), you have to meet the following criteria:  . Traveled to [place], [place], [place], [place] or [place]; or in the [place] to [place], [place], [place], or [place]; and have fever, cough, and shortness of breath within the last 2 weeks of travel OR . Been in close contact with a person diagnosed with COVID-19 within the last 2 weeks and have fever, cough, and shortness of breath . IF YOU DO NOT MEET THESE CRITERIA, YOU ARE CONSIDERED LOW RISK FOR COVID-19.  What to do if you are HIGH RISK for COVID-19?  . If you are having a medical emergency, call 911. . Seek medical care right away. Before you go to a doctor's office, urgent care or emergency department, call ahead and tell them about your recent travel, contact with someone diagnosed with COVID-19, and your symptoms. You should receive instructions from your physician's office regarding next steps of care.  . When you arrive at healthcare provider, tell the healthcare staff immediately you have returned from visiting [place], [place], [place], [place] or [place]; or traveled in the [place] to [place], [place], [place], or [place]; in the last two weeks or you have been in close contact with a person diagnosed with COVID-19 in the last 2 weeks.   . Tell the health care staff about your symptoms: fever, cough and shortness of breath. . After you have been seen by a medical provider, you will be either: o Tested for (COVID-19) and discharged home on quarantine except to seek medical care if symptoms worsen, and asked to  - Stay home and avoid contact with others until you get your results (4-5 days)  - Avoid travel on public transportation if possible (such as bus, train, or airplane) or o Sent to the Emergency Department by EMS for evaluation, COVID-19 testing, and possible  admission depending on your condition and test results.  What to do if you are LOW RISK for COVID-19?  Reduce your risk of any infection by using the same precautions used for avoiding the common cold or flu:  . Wash your hands often with soap and warm water for at least 20 seconds.  If soap and water are not readily available, use an alcohol-based hand sanitizer with at least 60% alcohol.  . If coughing or sneezing, cover your mouth and nose by coughing or sneezing into the elbow areas of your shirt or coat, into a tissue or into your sleeve (not your hands). . Avoid shaking hands with others and consider head nods or verbal greetings only. . Avoid touching your eyes, nose, or mouth with unwashed hands.  . Avoid close contact with people who are sick. . Avoid places or events with large numbers of people in one location, like concerts or sporting events. . Carefully consider travel plans you have or are making. . If you are planning any travel outside or inside the [place], visit the CDC's Travelers' Health webpage for the latest health notices. . If you have some symptoms but not all symptoms, continue to monitor at home and seek medical attention if your symptoms worsen. . If you are having a medical emergency, call 911.   ADDITIONAL HEALTHCARE OPTIONS FOR PATIENTS  [facility] / e-Visit: [email or web address]         [facility]: [phone]  [facility]: [phone]                   [facility]: [phone]   Pre-screen negative, DM.   

## 2019-01-31 ENCOUNTER — Other Ambulatory Visit: Payer: Self-pay

## 2019-01-31 ENCOUNTER — Encounter: Payer: Self-pay | Admitting: Obstetrics and Gynecology

## 2019-01-31 ENCOUNTER — Ambulatory Visit (INDEPENDENT_AMBULATORY_CARE_PROVIDER_SITE_OTHER): Payer: BLUE CROSS/BLUE SHIELD | Admitting: Obstetrics and Gynecology

## 2019-01-31 VITALS — BP 128/84 | HR 88 | Ht 65.0 in | Wt 146.9 lb

## 2019-01-31 DIAGNOSIS — N6332 Unspecified lump in axillary tail of the left breast: Secondary | ICD-10-CM | POA: Diagnosis not present

## 2019-01-31 DIAGNOSIS — Z0000 Encounter for general adult medical examination without abnormal findings: Secondary | ICD-10-CM | POA: Diagnosis not present

## 2019-01-31 DIAGNOSIS — R920 Mammographic microcalcification found on diagnostic imaging of breast: Secondary | ICD-10-CM

## 2019-01-31 NOTE — Progress Notes (Signed)
  Subjective:     Patient ID: Julie Washington, female   DOB: [birth year], 53 y.o.   MRN: [id number]  HPI  During exercise noticed a lump to the side of left breast toward armpit but not in armpit in the mirror in January. Not painful at any time. noticed it again last week and it now looks larger. Denies any trauma to the area or over exertion. Does shave but no changes in hygiene products.  Never had one in this area before. 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 Reviewed MMG from 03/2018. Request labs for yearly Routine screening while she is here today.  Review of Systems  All other systems reviewed and are negative.      Objective:   Physical Exam A&Ox4 Well groomed female in no distress Blood pressure 128/84, pulse 88, height 5\' 5"  (1.651 m), weight 146 lb 14.4 oz (66.6 kg). Breasts: symmetric fibrous changes in both upper outer quadrants, right breast normal without mass, skin or nipple changes or axillary nodes, abnormal mass palpable on upper outer tail of left breast near axilla, soft and mobile without skin changes and negative lymphadenopathy, 59mm x 39mm in size.    Assessment:     Left breast mass H/o microcalcifications of breast on prior MMG Routine health screening labs    Plan:     Diagnostic MMG bilaterally ordered with ultrasound-will follow up accordingly. Counseled on typically causes of breast and axilla changes and answered questions.  Labs requested were obtained and will follow up accordingly.   Julie Washington,CNM

## 2019-02-01 LAB — COMPREHENSIVE METABOLIC PANEL
ALT: 19 IU/L (ref 0–32)
AST: 24 IU/L (ref 0–40)
Albumin/Globulin Ratio: 2.3 — ABNORMAL HIGH (ref 1.2–2.2)
Albumin: 5 g/dL — ABNORMAL HIGH (ref 3.8–4.9)
Alkaline Phosphatase: 81 IU/L (ref 39–117)
BUN/Creatinine Ratio: 17 (ref 9–23)
BUN: 13 mg/dL (ref 6–24)
Bilirubin Total: 1.5 mg/dL — ABNORMAL HIGH (ref 0.0–1.2)
CO2: 24 mmol/L (ref 20–29)
Calcium: 10 mg/dL (ref 8.7–10.2)
Chloride: 100 mmol/L (ref 96–106)
Creatinine, Ser: 0.77 mg/dL (ref 0.57–1.00)
GFR calc Af Amer: 102 mL/min/{1.73_m2} (ref >59)
GFR calc non Af Amer: 88 mL/min/{1.73_m2} (ref >59)
Globulin, Total: 2.2 g/dL (ref 1.5–4.5)
Glucose: 94 mg/dL (ref 65–99)
Potassium: 4.3 mmol/L (ref 3.5–5.2)
Sodium: 141 mmol/L (ref 134–144)
Total Protein: 7.2 g/dL (ref 6.0–8.5)

## 2019-02-01 LAB — LIPID PANEL
Chol/HDL Ratio: 2.9 ratio (ref 0.0–4.4)
Cholesterol, Total: 211 mg/dL — ABNORMAL HIGH (ref 100–199)
HDL: 74 mg/dL (ref >39)
LDL Calculated: 127 mg/dL — ABNORMAL HIGH (ref 0–99)
Triglycerides: 51 mg/dL (ref 0–149)
VLDL Cholesterol Cal: 10 mg/dL (ref 5–40)

## 2019-02-01 LAB — VITAMIN D 25 HYDROXY (VIT D DEFICIENCY, FRACTURES): Vit D, 25-Hydroxy: 26 ng/mL — ABNORMAL LOW (ref 30.0–100.0)

## 2019-02-01 LAB — HEMOGLOBIN A1C
Est. average glucose Bld gHb Est-mCnc: 94 mg/dL
Hgb A1c MFr Bld: 4.9 % (ref 4.8–5.6)

## 2019-02-01 LAB — TSH: TSH: 4.07 u[IU]/mL (ref 0.450–4.500)

## 2019-02-08 IMAGING — MG MM DIGITAL DIAGNOSTIC UNILAT*R* W/ TOMO W/ CAD
6 series · 6 of 14 positions shown · non-contrast
Comparison: Previous exam(s).

CLINICAL DATA: Short-term interval follow-up of probable benign
calcifications in the right breast.

EXAM:
DIGITAL DIAGNOSTIC UNILATERAL RIGHT MAMMOGRAM WITH CAD AND TOMO

[R CC]
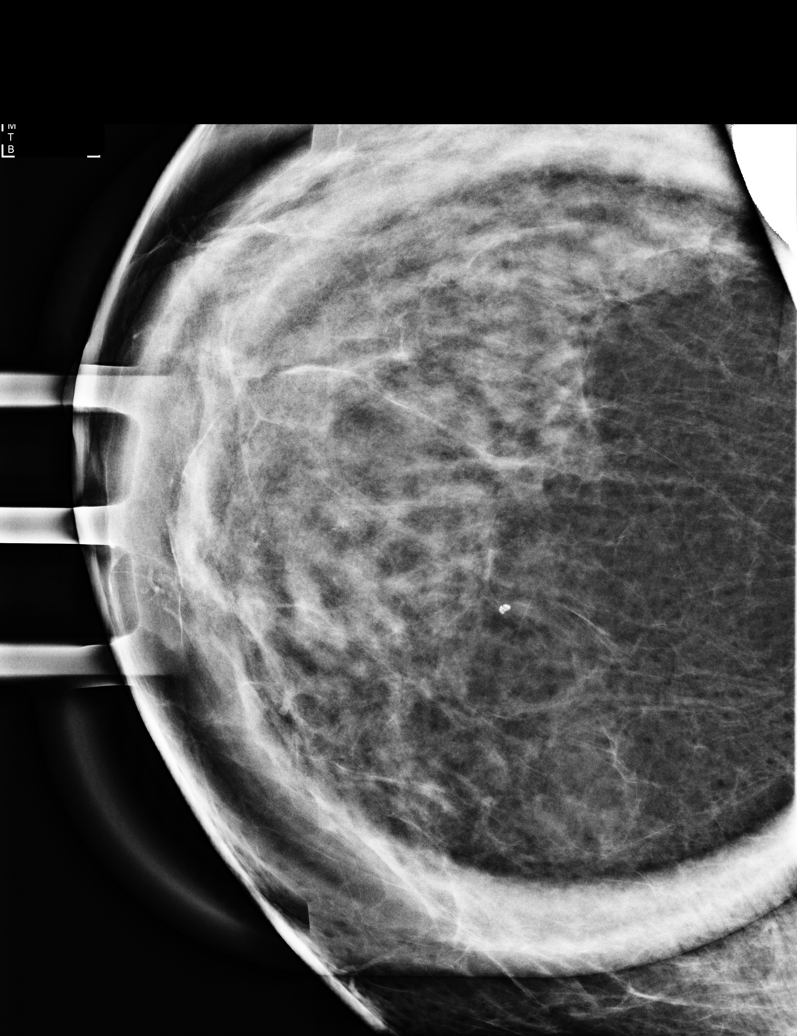

[R LM]
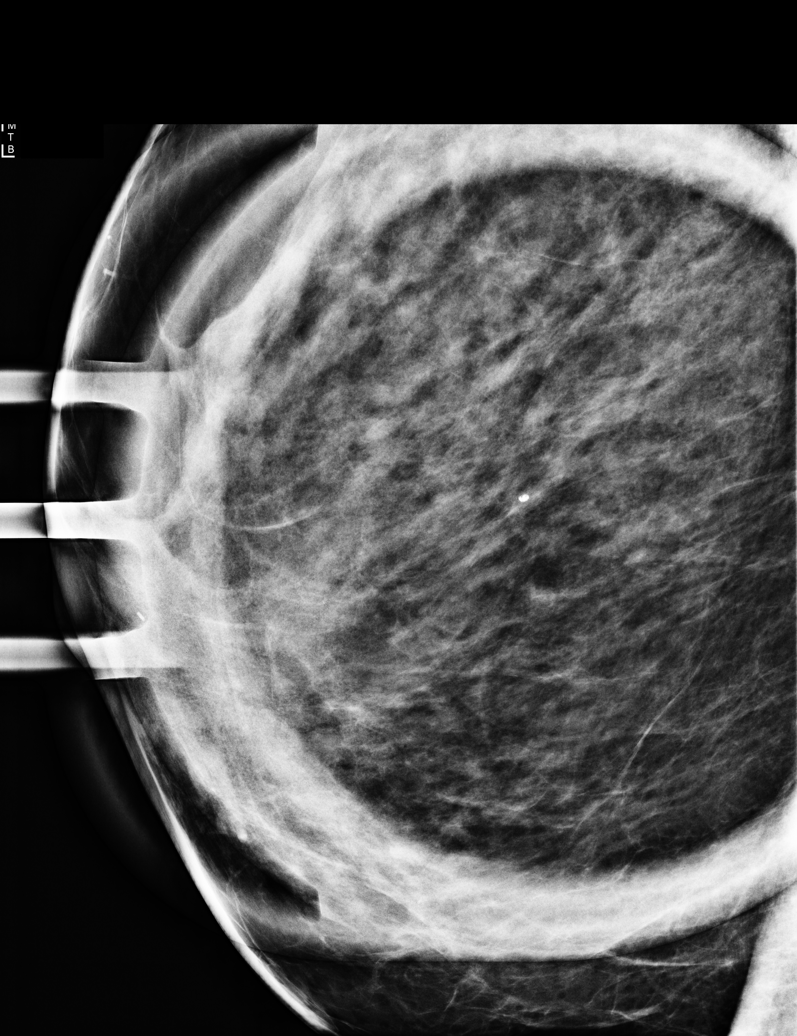

[R CC synth-2D]
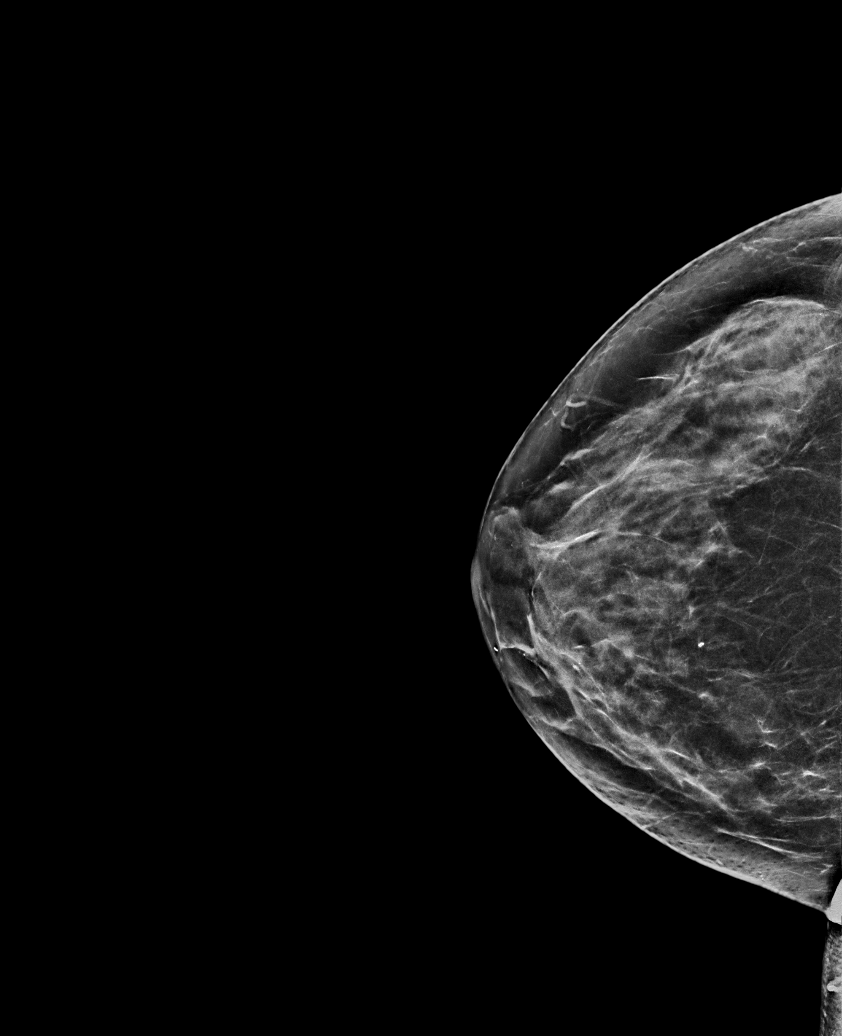

[R MLO synth-2D]
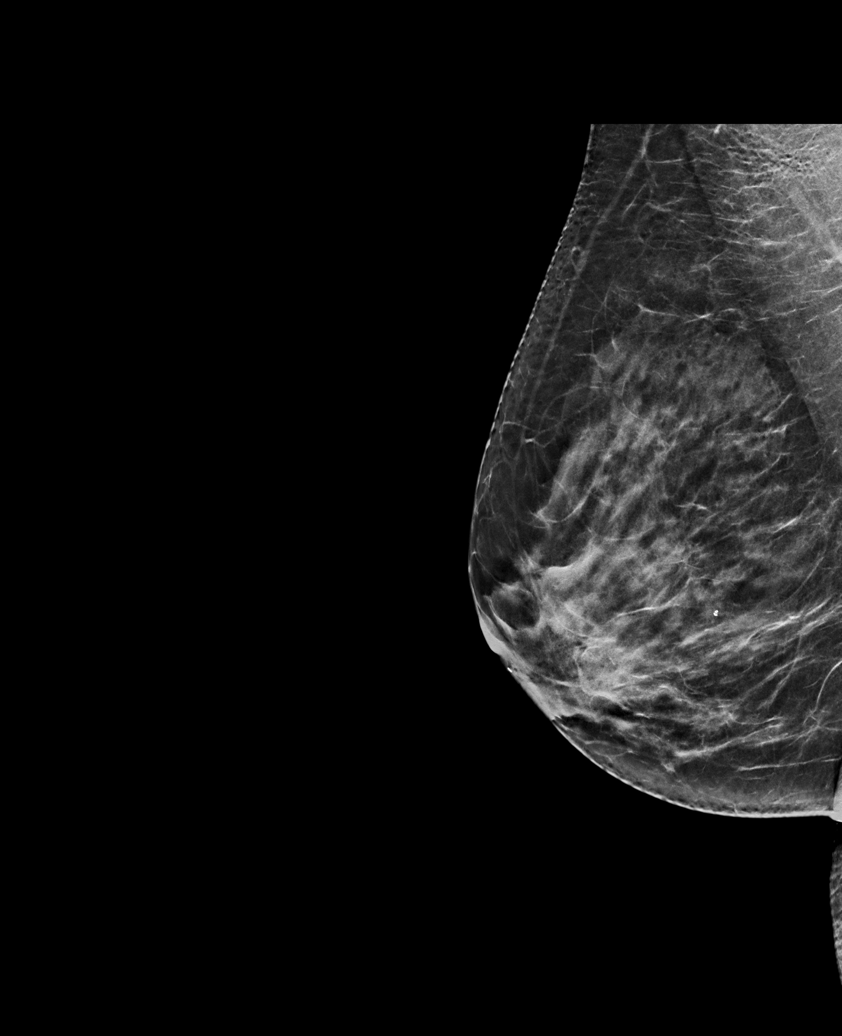

[R MLO tomo · tomo slice 34/67.0]
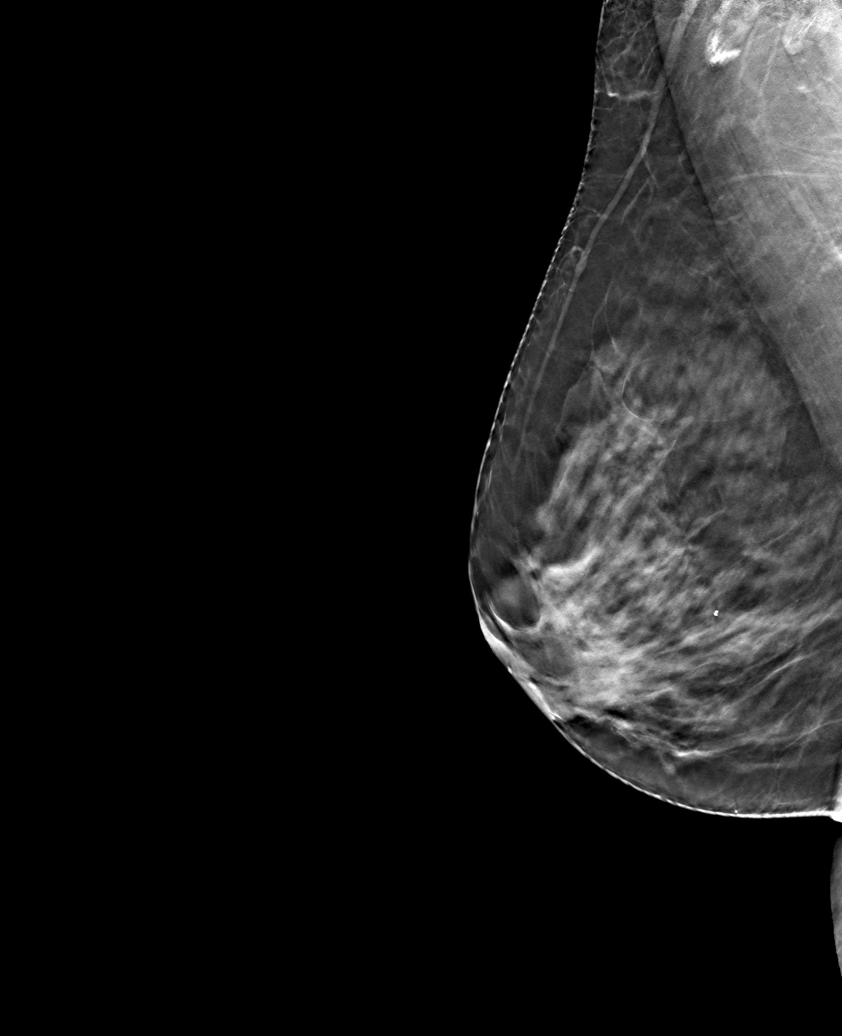

[R CC tomo · tomo slice 35/68.0]
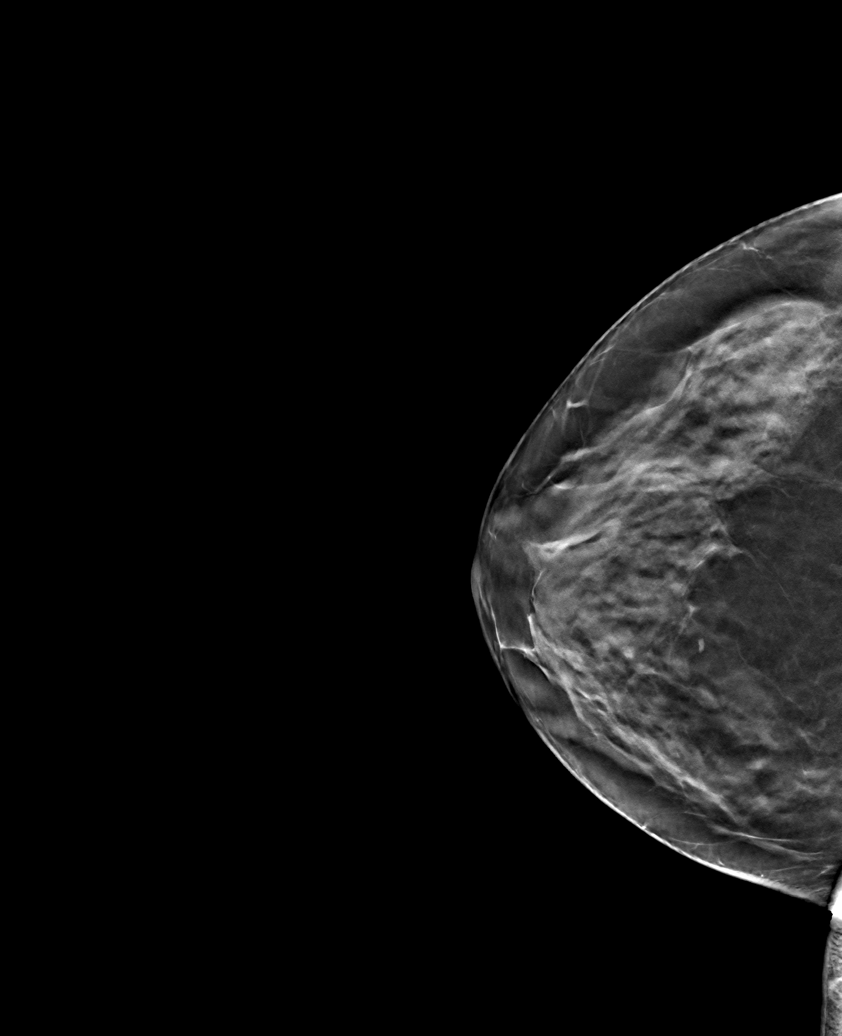

[6 of 14 positions shown; findings below may reference images not displayed]

ACR Breast Density Category c: The breast tissue is heterogeneously
dense, which may obscure small masses.
FINDINGS: Previously described punctate 1 mm calcifications in the central
right breast are more coarse and now have a dystrophic appearance.
No suspicious mass or malignant type microcalcifications identified
in the right breast.

Mammographic images were processed with CAD.
IMPRESSION: Benign-appearing calcifications in the right breast. No evidence of
malignancy.

RECOMMENDATION:
Bilateral diagnostic mammogram in Monday May, 2018 is recommended
to complete the year follow-up.

I have discussed the findings and recommendations with the patient.
Results were also provided in writing at the conclusion of the
visit. If applicable, a reminder letter will be sent to the patient
regarding the next appointment.

BI-RADS CATEGORY  3: Probably benign.

## 2019-02-09 ENCOUNTER — Ambulatory Visit
Admission: RE | Admit: 2019-02-09 | Discharge: 2019-02-09 | Disposition: A | Discharge status: Home / Self Care | Payer: BLUE CROSS/BLUE SHIELD | Source: Ambulatory Visit | Attending: Obstetrics and Gynecology | Admitting: Obstetrics and Gynecology

## 2019-02-09 ENCOUNTER — Other Ambulatory Visit: Payer: Self-pay | Admitting: Obstetrics and Gynecology

## 2019-02-09 ENCOUNTER — Other Ambulatory Visit: Payer: Self-pay

## 2019-02-09 DIAGNOSIS — N6332 Unspecified lump in axillary tail of the left breast: Secondary | ICD-10-CM | POA: Diagnosis present

## 2019-02-09 DIAGNOSIS — R920 Mammographic microcalcification found on diagnostic imaging of breast: Secondary | ICD-10-CM | POA: Diagnosis present

## 2019-02-09 DIAGNOSIS — E559 Vitamin D deficiency, unspecified: Secondary | ICD-10-CM | POA: Insufficient documentation

## 2019-02-09 MED ORDER — VITAMIN D (ERGOCALCIFEROL) 1.25 MG (50000 UNIT) PO CAPS: 50000.0000 [IU] | ORAL_CAPSULE | ORAL | 1 refills | Status: DC

## 2019-03-21 ENCOUNTER — Other Ambulatory Visit: Payer: Self-pay | Admitting: Obstetrics and Gynecology

## 2019-03-21 ENCOUNTER — Telehealth: Payer: Self-pay | Admitting: Obstetrics and Gynecology

## 2019-03-21 NOTE — Telephone Encounter (Signed)
Is this ok??  What labs to order??

## 2019-03-21 NOTE — Telephone Encounter (Signed)
We just did routine labs end of May (no testosterone) and only thing low was vit D. She is scheduled for AE in Sept. And planned to recheck vit D then, as it is too soon to check it now.  Would have to have a reason to check testosterone now? Also can just do it in Sept.  So I would wait.

## 2019-03-21 NOTE — Telephone Encounter (Signed)
The patient called and stated that she is wanting to come in for labs to check her testosterone levels. The patient did not disclose any other information. Please advise.

## 2019-03-22 ENCOUNTER — Encounter: Payer: Self-pay | Admitting: *Deleted

## 2019-03-22 NOTE — Telephone Encounter (Signed)
Discussed with pt she is having low libido, has been working out for 2 months and low calorie diet and cant lose weight

## 2019-03-23 ENCOUNTER — Other Ambulatory Visit: Payer: Self-pay

## 2019-03-23 ENCOUNTER — Other Ambulatory Visit: Payer: BC Managed Care – PPO

## 2019-03-23 DIAGNOSIS — R6882 Decreased libido: Secondary | ICD-10-CM

## 2019-03-24 LAB — TESTOSTERONE: Testosterone: 6 ng/dL (ref 3–41)

## 2019-03-29 ENCOUNTER — Telehealth: Payer: Self-pay | Admitting: Obstetrics and Gynecology

## 2019-03-29 NOTE — Telephone Encounter (Signed)
The patient called and stated that her testosterone levels were low on her most recent results and the patient is concerned and wanting to know what her provider would like like to do next. Pt aware provider is out of office, Pt voiced understanding. Please advise.

## 2019-05-31 ENCOUNTER — Encounter: Payer: BLUE CROSS/BLUE SHIELD | Admitting: Obstetrics and Gynecology

## 2019-09-10 ENCOUNTER — Telehealth: Payer: Self-pay

## 2019-09-10 ENCOUNTER — Telehealth: Payer: Self-pay | Admitting: Certified Nurse Midwife

## 2019-09-10 NOTE — Telephone Encounter (Signed)
mychart message sent to patient regarding Vit D prescription.

## 2019-09-10 NOTE — Telephone Encounter (Addendum)
Pt in needed a refill on her vitamin d. Pt stated that she takes it 2 times a day and that she needs larger dose. Please advise

## 2020-01-10 IMAGING — US US BREAST*L* LIMITED INC AXILLA
1 series · 7 of 7 positions shown · non-contrast
Comparison: Previous exam(s).

CLINICAL DATA: Patient for short-term follow-up probably benign
right breast calcifications and newly palpable left axillary mass.

EXAM:
DIGITAL DIAGNOSTIC BILATERAL MAMMOGRAM WITH CAD AND TOMO
ULTRASOUND LEFT BREAST

[Series 1: us breast*left* limited inc axilla · 0.07mm/px · 7 of 7 slices shown]
[im 1/7]
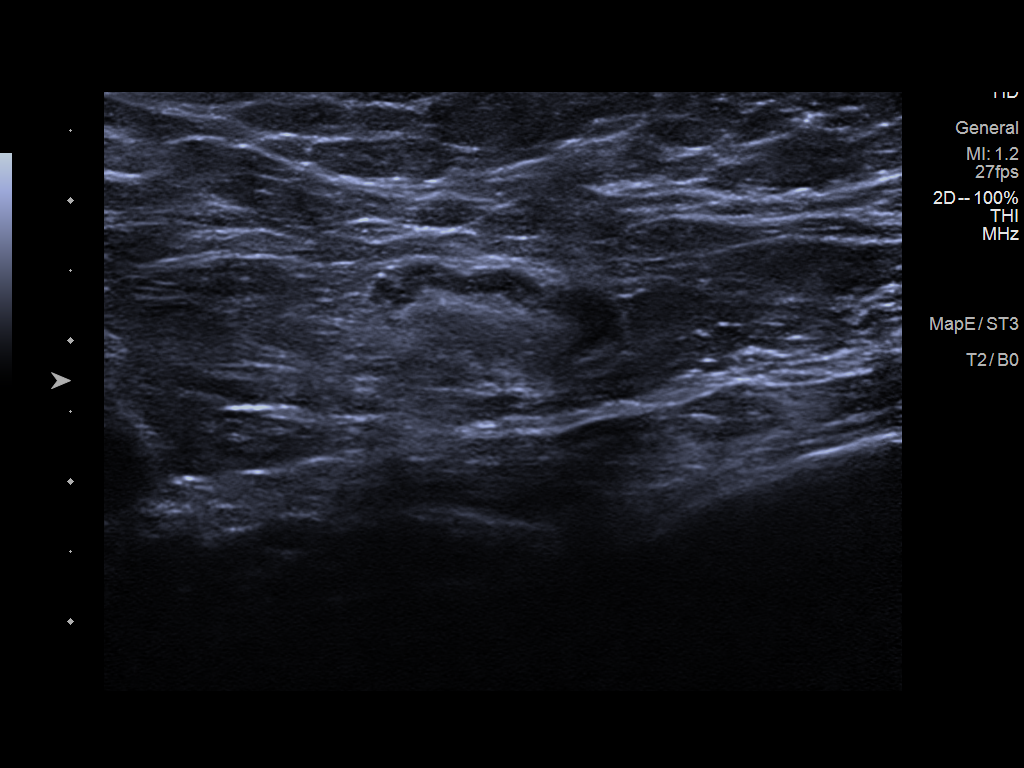
[im 2/7]
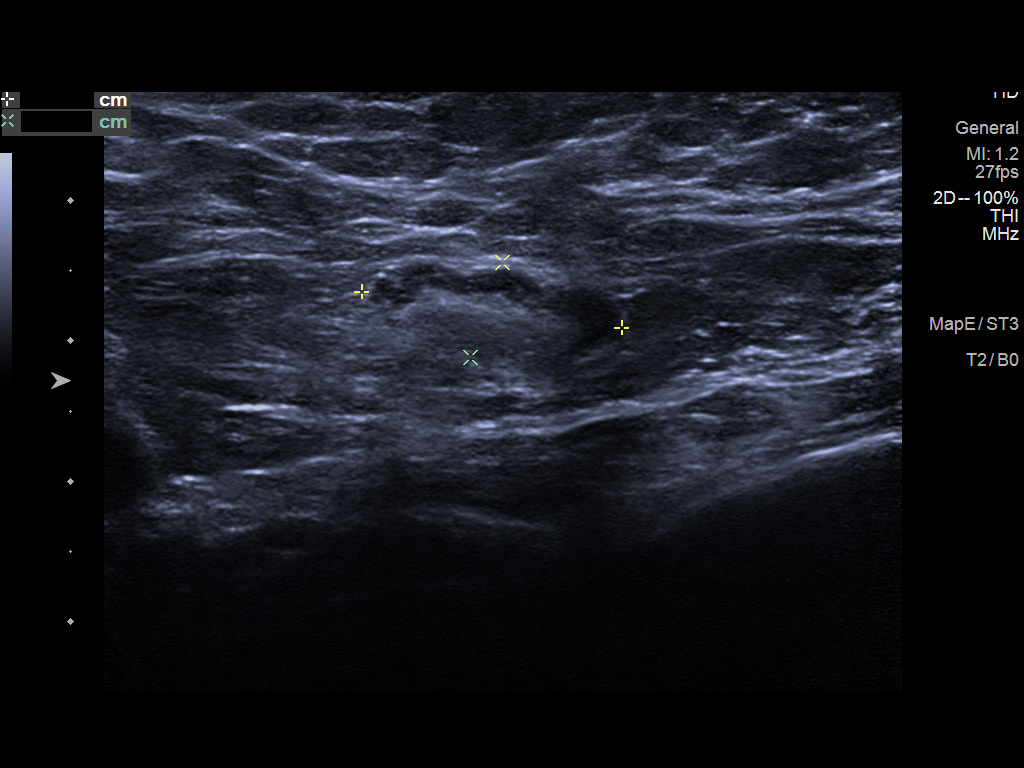
[im 3/7]
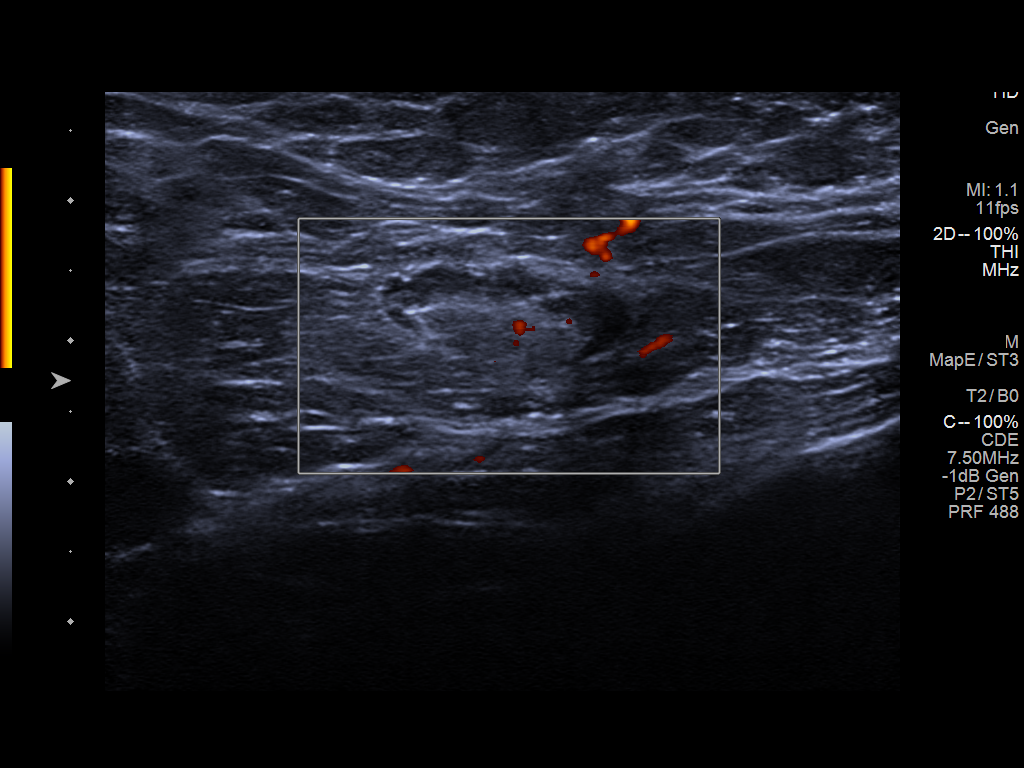
[im 4/7]
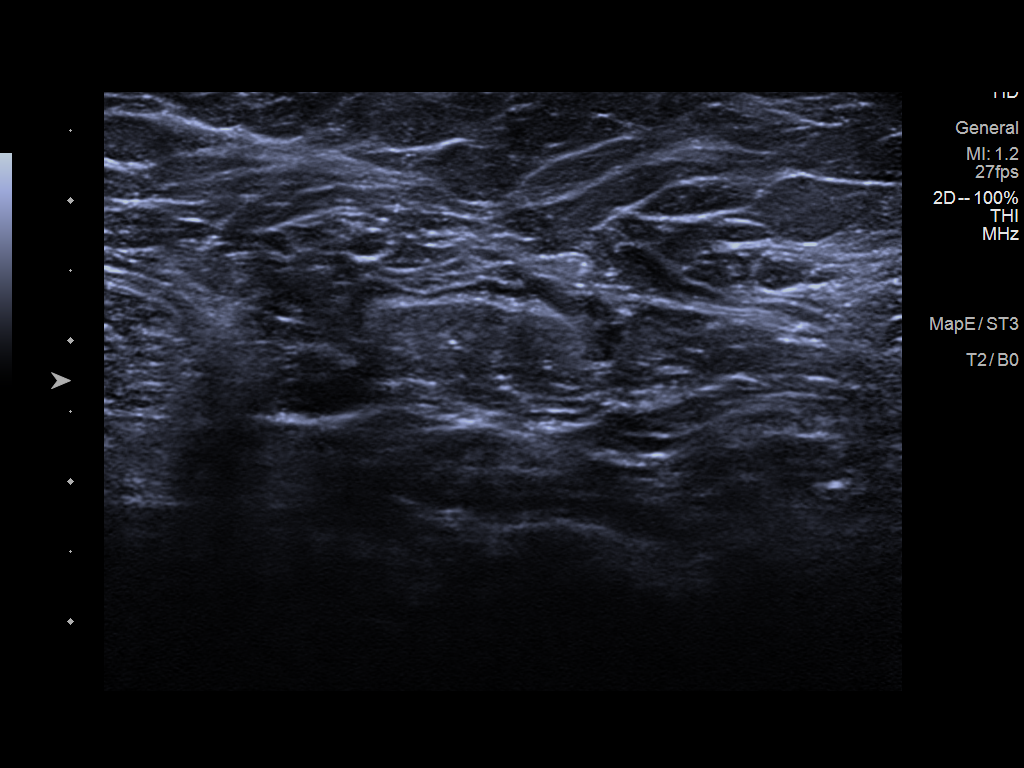
[im 5/7]
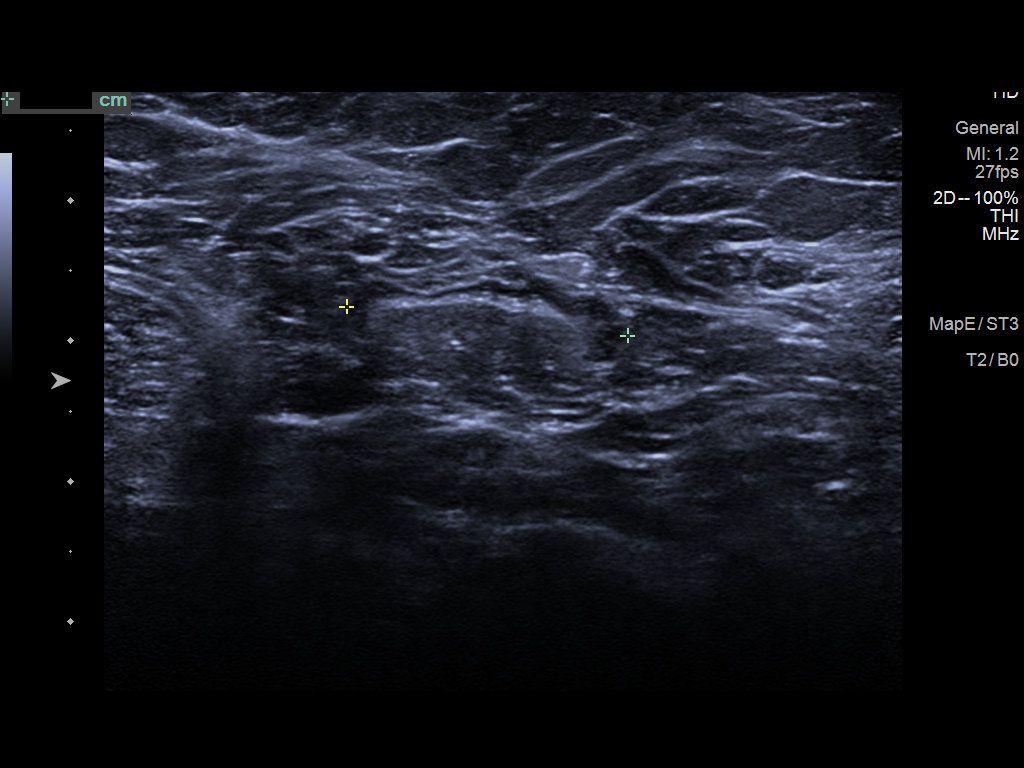
[im 6/7]
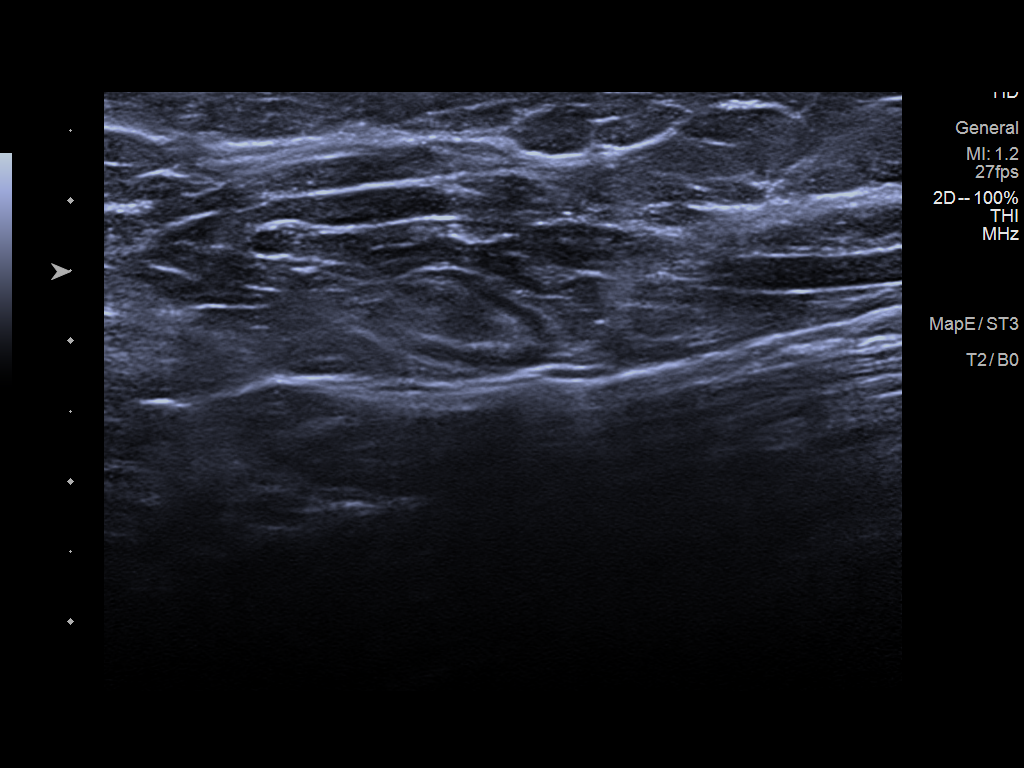
[im 7/7]
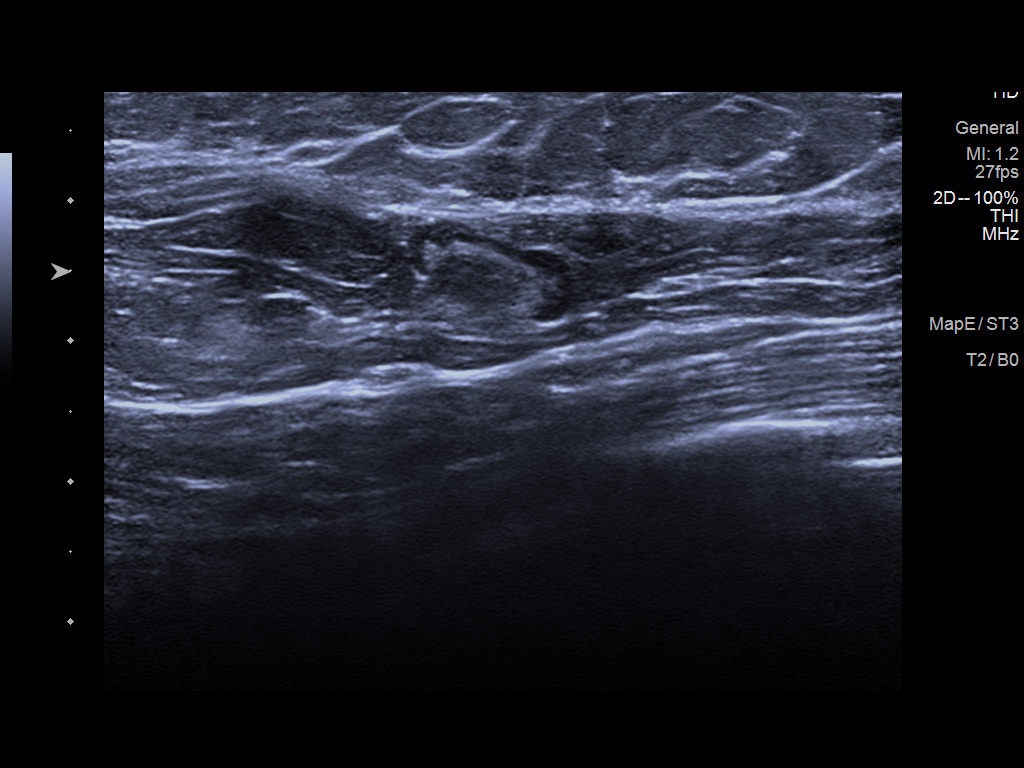

[7 of 7 positions shown; findings below may reference images not displayed]

ACR Breast Density Category c: The breast tissue is heterogeneously
dense, which may obscure small masses.
FINDINGS: Magnification views of the right breast demonstrate a single coarse
calcification within the medial right breast, compatible with benign
process. No additional concerning masses, calcifications or
distortion identified within either breast. No suspicious
abnormality within the left axilla underlying the palpable marker.

Mammographic images were processed with CAD.

On physical exam, soft tissue is palpated within the left axilla.

Targeted ultrasound is performed, showing normal left axillary
contents without suspicious mass.
IMPRESSION: No mammographic evidence for malignancy.

No suspicious abnormality within the left axilla at the site of
palpable concern.

Benign right breast calcification.

RECOMMENDATION:
Screening mammogram in one year.(Code:O1-9-FY9).

Continued clinical evaluation for left breast/axillary palpable
concern.

I have discussed the findings and recommendations with the patient.
Results were also provided in writing at the conclusion of the
visit. If applicable, a reminder letter will be sent to the patient
regarding the next appointment.

BI-RADS CATEGORY  2: Benign.

## 2020-08-12 ENCOUNTER — Telehealth: Payer: Self-pay

## 2020-08-12 NOTE — Telephone Encounter (Signed)
mychart message sent to patient to call and schedule AE.

## 2021-03-11 ENCOUNTER — Other Ambulatory Visit: Payer: Self-pay

## 2021-03-30 ENCOUNTER — Other Ambulatory Visit: Payer: Self-pay

## 2021-03-30 ENCOUNTER — Encounter: Payer: Self-pay | Admitting: Certified Nurse Midwife

## 2021-03-30 ENCOUNTER — Telehealth: Payer: Self-pay | Admitting: Certified Nurse Midwife

## 2021-03-30 ENCOUNTER — Ambulatory Visit: Payer: Self-pay | Admitting: Certified Nurse Midwife

## 2021-03-30 VITALS — BP 139/81 | HR 62 | Ht 65.0 in | Wt 111.6 lb

## 2021-03-30 DIAGNOSIS — Z1231 Encounter for screening mammogram for malignant neoplasm of breast: Secondary | ICD-10-CM

## 2021-03-30 DIAGNOSIS — Z01419 Encounter for gynecological examination (general) (routine) without abnormal findings: Secondary | ICD-10-CM

## 2021-03-30 MED ORDER — VITAMIN D (ERGOCALCIFEROL) 1.25 MG (50000 UNIT) PO CAPS: 50000.0000 [IU] | ORAL_CAPSULE | ORAL | 1 refills | Status: DC

## 2021-03-30 MED ORDER — CLOBETASOL PROPIONATE 0.05 % EX OINT: 1.0000 "application " | TOPICAL_OINTMENT | Freq: Two times a day (BID) | CUTANEOUS | 0 refills | Status: AC

## 2021-03-30 MED ORDER — CLOBETASOL PROPIONATE 0.05 % EX OINT: 1.0000 "application " | TOPICAL_OINTMENT | Freq: Two times a day (BID) | CUTANEOUS | 0 refills | Status: DC

## 2021-03-30 MED ORDER — VITAMIN D (ERGOCALCIFEROL) 1.25 MG (50000 UNIT) PO CAPS: 50000.0000 [IU] | ORAL_CAPSULE | ORAL | 1 refills | Status: AC

## 2021-03-30 NOTE — Telephone Encounter (Signed)
LVM with patient informing her meds have been sent to pharmacy of choice.

## 2021-03-30 NOTE — Progress Notes (Signed)
GYNECOLOGY ANNUAL PREVENTATIVE CARE ENCOUNTER NOTE  History:     Julie Washington is a 55 y.o. G2P2 female here for a routine annual gynecologic exam.  Current complaints: none.   Denies abnormal vaginal bleeding, discharge, pelvic pain, problems with intercourse or other gynecologic concerns.     Social Relationship: married (2 adult children) Living:Spouse  Work: retired Exercise: daily x 1 hr  Smoke/Alcohol/drug use: alcohol 2 x wkly, denies drug use and smoking  Gynecologic History No LMP recorded. Patient has had a hysterectomy. Contraception: status post hysterectomy Last Pap: 2018. Results were: normal with negative HPV Last mammogram: 2020. Results were: normal  Obstetric History OB History  Gravida Para Term Preterm AB Living  2 2          SAB IAB Ectopic Multiple Live Births               # Outcome Date GA Lbr Len/2nd Weight Sex Delivery Anes PTL Lv  2 Para 1989    M Vag-Spont     1 Para 1988    F Vag-Spont       Past Medical History:  Diagnosis Date   Allergy    Exercise-induced asthma     Past Surgical History:  Procedure Laterality Date   BREAST EXCISIONAL BIOPSY Left 2015    cyst   COLONOSCOPY WITH PROPOFOL N/A 07/14/2018   Procedure: COLONOSCOPY WITH PROPOFOL;  Surgeon: Lucilla Lame, MD;  Location: [facility];  Service: Endoscopy;  Laterality: N/A;   POLYPECTOMY  07/14/2018   Procedure: POLYPECTOMY;  Surgeon: Lucilla Lame, MD;  Location: [facility];  Service: Endoscopy;;   VAGINAL HYSTERECTOMY      Current Outpatient Medications on File Prior to Visit  Medication Sig Dispense Refill   albuterol (PROVENTIL HFA;VENTOLIN HFA) 108 (90 Base) MCG/ACT inhaler Inhale 2 puffs into the lungs every 4 (four) hours as needed for wheezing. 1 Inhaler 3   azelastine (OPTIVAR) 0.05 % ophthalmic solution      ibuprofen (ADVIL,MOTRIN) 400 MG tablet Take 400 mg by mouth every 6 (six) hours as needed.     Vitamin D, Ergocalciferol, (DRISDOL) 1.25 MG  (50000 UT) CAPS capsule Take 1 capsule (50,000 Units total) by mouth every 7 (seven) days. 30 capsule 1   Spacer/Aero-Holding Chambers (AEROCHAMBER PLUS) inhaler Use as instructed (Patient not taking: No sig reported) 1 each 2   No current facility-administered medications on file prior to visit.    No Known Allergies  Social History:  reports that she quit smoking about 23 years ago. Her smoking use included cigarettes. She has never used smokeless tobacco. She reports current alcohol use of about 3.0 standard drinks of alcohol per week. She reports that she does not use drugs.  Family History  Problem Relation Age of Onset   Diabetes Mother    Diabetes Father    Heart disease Father    Cancer Maternal Grandmother    Stroke Maternal Grandmother    Heart disease Paternal Grandfather    Breast cancer Other     The following portions of the patient's history were reviewed and updated as appropriate: allergies, current medications, past family history, past medical history, past social history, past surgical history and problem list.  Review of Systems Pertinent items noted in HPI and remainder of comprehensive ROS otherwise negative.  Physical Exam:  BP 139/81   Pulse 62   Ht '5\' 5"'$  (1.651 m)   Wt 111 lb 9.6 oz (50.6 kg)   BMI  18.57 kg/m  CONSTITUTIONAL: Well-developed, well-nourished female in no acute distress.  HENT:  Normocephalic, atraumatic, External right and left ear normal. Oropharynx is clear and moist EYES: Conjunctivae and EOM are normal. Pupils are equal, round, and reactive to light. No scleral icterus.  NECK: Normal range of motion, supple, no masses.  Normal thyroid.  SKIN: Skin is warm and dry. No rash noted. Not diaphoretic. No erythema. No pallor. MUSCULOSKELETAL: Normal range of motion. No tenderness.  No cyanosis, clubbing, or edema.  2+ distal pulses. NEUROLOGIC: Alert and oriented to person, place, and time. Normal reflexes, muscle tone coordination.   PSYCHIATRIC: Normal mood and affect. Normal behavior. Normal judgment and thought content. CARDIOVASCULAR: Normal heart rate noted, regular rhythm RESPIRATORY: Clear to auscultation bilaterally. Effort and breath sounds normal, no problems with respiration noted. BREASTS: Symmetric in size. No masses, tenderness, skin changes, nipple drainage, or lymphadenopathy bilaterally.  ABDOMEN: Soft, no distention noted.  No tenderness, rebound or guarding.  PELVIC: Normal appearing external genitalia and urethral meatus; normal appearing vaginal mucosa and cervix.  No abnormal discharge noted.  Pap smear obtained.  Normal uterine size, no other palpable masses, no uterine or adnexal tenderness.  .   Assessment and Plan:    1. Well woman exam with routine gynecological exam    Pap: not indicated  Mammogram : ordered Labs:declines hep c/HIV Refills:clobetasol, vit D Referral: none  Routine preventative health maintenance measures emphasized. Please refer to After Visit Summary for other counseling recommendations.      Philip Aspen, CNM [facility]

## 2021-03-30 NOTE — Telephone Encounter (Signed)
[name] called in and states that since she no longer has insurance she was able to find Forman and DRISDOL cheaper at [facility].  [name] would like these called in to [facility] in [facility]

## 2022-04-05 ENCOUNTER — Ambulatory Visit (INDEPENDENT_AMBULATORY_CARE_PROVIDER_SITE_OTHER): Payer: Self-pay | Admitting: Certified Nurse Midwife

## 2022-04-05 VITALS — BP 124/77 | HR 67 | Ht 64.0 in | Wt 112.5 lb

## 2022-04-05 DIAGNOSIS — Z01419 Encounter for gynecological examination (general) (routine) without abnormal findings: Secondary | ICD-10-CM

## 2022-04-05 DIAGNOSIS — Z1231 Encounter for screening mammogram for malignant neoplasm of breast: Secondary | ICD-10-CM

## 2022-04-05 NOTE — Progress Notes (Signed)
GYNECOLOGY ANNUAL PREVENTATIVE CARE ENCOUNTER NOTE  History:     Aradhana Gin is a 56 y.o. G2P2 female here for a routine annual gynecologic exam.  Current complaints: none.   Denies abnormal vaginal bleeding, discharge, pelvic pain, problems with intercourse or other gynecologic concerns.     Social Relationship:married Living: with spouse Work: retired  ( camps in [employer] ) Exercise: 1 hr daily  Smoke/Alcohol/drug use: alcohol 2 x wk, denies drugs and smoking.   Gynecologic History No LMP recorded. Patient has had a hysterectomy. Contraception: status post hysterectomy Last Pap: 2018. Results were: normal with negative HPV Last mammogram: 02/09/2019. Results were: normal  Obstetric History OB History  Gravida Para Term Preterm AB Living  2 2          SAB IAB Ectopic Multiple Live Births               # Outcome Date GA Lbr Len/2nd Weight Sex Delivery Anes PTL Lv  2 Para 1989    M Vag-Spont     1 Para 1988    F Vag-Spont       Past Medical History:  Diagnosis Date   Allergy    Exercise-induced asthma     Past Surgical History:  Procedure Laterality Date   BREAST EXCISIONAL BIOPSY Left 2015    cyst   COLONOSCOPY WITH PROPOFOL N/A 07/14/2018   Procedure: COLONOSCOPY WITH PROPOFOL;  Surgeon: Lucilla Lame, MD;  Location: [facility];  Service: Endoscopy;  Laterality: N/A;   POLYPECTOMY  07/14/2018   Procedure: POLYPECTOMY;  Surgeon: Lucilla Lame, MD;  Location: [facility];  Service: Endoscopy;;   VAGINAL HYSTERECTOMY      Current Outpatient Medications on File Prior to Visit  Medication Sig Dispense Refill   albuterol (PROVENTIL HFA;VENTOLIN HFA) 108 (90 Base) MCG/ACT inhaler Inhale 2 puffs into the lungs every 4 (four) hours as needed for wheezing. 1 Inhaler 3   ibuprofen (ADVIL,MOTRIN) 400 MG tablet Take 400 mg by mouth every 6 (six) hours as needed.     Vitamin D, Ergocalciferol, (DRISDOL) 1.25 MG (50000 UNIT) CAPS capsule Take 1 capsule (50,000 Units  total) by mouth every 7 (seven) days. 30 capsule 1   azelastine (OPTIVAR) 0.05 % ophthalmic solution  (Patient not taking: Reported on 04/05/2022)     Spacer/Aero-Holding Chambers (AEROCHAMBER PLUS) inhaler Use as instructed (Patient not taking: Reported on 01/31/2019) 1 each 2   No current facility-administered medications on file prior to visit.    No Known Allergies  Social History:  reports that she quit smoking about 24 years ago. Her smoking use included cigarettes. She has never used smokeless tobacco. She reports current alcohol use of about 3.0 standard drinks of alcohol per week. She reports that she does not use drugs.  Family History  Problem Relation Age of Onset   Diabetes Mother    Diabetes Father    Heart disease Father    Cancer Maternal Grandmother    Stroke Maternal Grandmother    Heart disease Paternal Grandfather    Breast cancer Other     The following portions of the patient's history were reviewed and updated as appropriate: allergies, current medications, past family history, past medical history, past social history, past surgical history and problem list.  Review of Systems Pertinent items noted in HPI and remainder of comprehensive ROS otherwise negative.  Physical Exam:  BP 124/77   Pulse 67   Ht '5\' 4"'$  (1.626 m)   Wt 112  lb 8 oz (51 kg)   BMI 19.31 kg/m  CONSTITUTIONAL: Well-developed, well-nourished female in no acute distress.  HENT:  Normocephalic, atraumatic, External right and left ear normal. Oropharynx is clear and moist EYES: Conjunctivae and EOM are normal. Pupils are equal, round, and reactive to light. No scleral icterus.  NECK: Normal range of motion, supple, no masses.  Normal thyroid.  SKIN: Skin is warm and dry. No rash noted. Not diaphoretic. No erythema. No pallor. MUSCULOSKELETAL: Normal range of motion. No tenderness.  No cyanosis, clubbing, or edema.  2+ distal pulses. NEUROLOGIC: Alert and oriented to person, place, and time.  Normal reflexes, muscle tone coordination.  PSYCHIATRIC: Normal mood and affect. Normal behavior. Normal judgment and thought content. CARDIOVASCULAR: Normal heart rate noted, regular rhythm RESPIRATORY: Clear to auscultation bilaterally. Effort and breath sounds normal, no problems with respiration noted. BREASTS: Symmetric in size. No masses, tenderness, skin changes, nipple drainage, or lymphadenopathy bilaterally.  ABDOMEN: Soft, no distention noted.  No tenderness, rebound or guarding.  PELVIC: Normal appearing external genitalia and urethral meatus; normal appearing vaginal mucosa. Cervix absent.  No abnormal discharge noted uterine absent, no other palpable masses, no uterine or adnexal tenderness.  .   Assessment and Plan:    1. Well woman exam with routine gynecological exam   Pap: n/a  Mammogram : ordered ( pt does not have insurance looking into cost effective options)  Labs: none Refills: none Referral: none Routine preventative health maintenance measures emphasized. Please refer to After Visit Summary for other counseling recommendations.      Philip Aspen, CNM [facility]
